# Patient Record
Sex: Male | Born: 1970 | Race: Black or African American | Hispanic: No | Smoking: Never smoker
Health system: Southern US, Community
[De-identification: ages and names within clinical notes are randomized; demographics above are authoritative.]

---

## 2002-10-27 ENCOUNTER — Emergency Department (HOSPITAL_COMMUNITY): Admission: EM | Admit: 2002-10-27 | Discharge: 2002-10-28 | Payer: Self-pay | Admitting: Emergency Medicine

## 2002-10-27 ENCOUNTER — Encounter: Payer: Self-pay | Admitting: Emergency Medicine

## 2007-05-04 ENCOUNTER — Emergency Department (HOSPITAL_COMMUNITY): Admission: EM | Admit: 2007-05-04 | Discharge: 2007-05-04 | Payer: Self-pay | Admitting: Emergency Medicine

## 2007-05-08 ENCOUNTER — Emergency Department (HOSPITAL_COMMUNITY): Admission: EM | Admit: 2007-05-08 | Discharge: 2007-05-08 | Payer: Self-pay | Admitting: Family Medicine

## 2007-05-23 ENCOUNTER — Ambulatory Visit: Payer: Self-pay | Admitting: Family Medicine

## 2007-05-29 ENCOUNTER — Encounter: Admission: RE | Admit: 2007-05-29 | Discharge: 2007-05-29 | Payer: Self-pay | Admitting: Family Medicine

## 2007-07-05 ENCOUNTER — Encounter: Admission: RE | Admit: 2007-07-05 | Discharge: 2007-07-05 | Payer: Self-pay | Admitting: Cardiovascular Disease

## 2007-07-11 ENCOUNTER — Inpatient Hospital Stay (HOSPITAL_COMMUNITY): Admission: RE | Admit: 2007-07-11 | Discharge: 2007-07-12 | Payer: Self-pay | Admitting: Cardiovascular Disease

## 2007-07-14 ENCOUNTER — Inpatient Hospital Stay (HOSPITAL_COMMUNITY): Admission: EM | Admit: 2007-07-14 | Discharge: 2007-07-17 | Payer: Self-pay | Admitting: Emergency Medicine

## 2008-10-28 ENCOUNTER — Emergency Department (HOSPITAL_COMMUNITY): Admission: EM | Admit: 2008-10-28 | Discharge: 2008-10-28 | Payer: Self-pay | Admitting: Emergency Medicine

## 2008-10-31 ENCOUNTER — Ambulatory Visit: Payer: Self-pay | Admitting: Family Medicine

## 2008-11-03 ENCOUNTER — Emergency Department (HOSPITAL_COMMUNITY): Admission: EM | Admit: 2008-11-03 | Discharge: 2008-11-03 | Payer: Self-pay | Admitting: Emergency Medicine

## 2010-05-26 ENCOUNTER — Ambulatory Visit (HOSPITAL_COMMUNITY)
Admission: RE | Admit: 2010-05-26 | Discharge: 2010-05-26 | Payer: Self-pay | Source: Home / Self Care | Attending: Orthopaedic Surgery | Admitting: Orthopaedic Surgery

## 2010-10-27 NOTE — Discharge Summary (Signed)
NAMEEMIL, WEIGOLD              ACCOUNT NO.:  [id number]   MEDICAL RECORD NO.:  [id number]          PATIENT TYPE:  INP   LOCATION:  4736                         FACILITY:  [facility]   PHYSICIAN:  Nanetta Batty, M.D.   DATE OF BIRTH:  [birth year]   DATE OF ADMISSION:  07/11/2007  DATE OF DISCHARGE:  07/12/2007                               DISCHARGE SUMMARY   DISCHARGE DIAGNOSES:  1. Claudication of lower extremities.  2. Abnormal nuclear stress test, cardiac.  3. Patent coronary artery with normal LV function by cardiac      catheterization.  4. Peripheral vascular disease with bilateral iliac disease; 80%      stenosis on the left, 70% right.  Undergoing PTA and stent      deployments bilaterally by Dr. [name].  5. Dyslipidemia.  6. Somewhat labile blood pressure.  7. Mild anemia.   DISCHARGE CONDITION:  Improved.   PROCEDURES:  July 11, 2007, combined left heart catheterization by  Dr. Nanetta Batty.   July 11, 2007, PV angiogram, lower extremities and proceeded on with  PTA and stent bilateral iliacs by Dr. [name].   DISCHARGE MEDICATIONS:  1. Aspirin 81 mg daily.  2. Metoprolol 25 mg b.i.d.  3. Plavix 75 mg daily.  4. Fish oil capsules, 1 daily for now, over-the-counter.  5. Zocor 10 mg daily. The  patient at the time of discharge, stated he      had been on over-the-counter cholesterol medication and he would      prefer to take it if possible.  In fact, his LDLs actually are      pretty good, so I gave him the option of either starting the Zocor,      holding the over-the-counter, but to take the over-the-counter      medicine.  To see Dr. [name] for followup.  Most likely he will need      the Zocor, as his triglycerides are up, his HDL is low.  He      definitely needs the fish oil and he may need some Niaspan, as      well, but I will leave that with Dr. [name] and Dr. Lynnea Ferrier.   He is also instructed to watch his diet, decrease white bread and  potatoes,  decrease sweets and alcohol.   DISCHARGE INSTRUCTIONS:  1. No work for at least 1 week.  2. Wash catheterization site with soap and water.  Call us if any      bleeding, swelling, or drainage.  3. Increase activities slowly.  4. May shower or bathe.  No lifting for 2 days.  No driving for 2      days.  5. Followup with Dr. [name] July 28, 2007 at 10:15 a.m.  6. He is scheduled for lower extremity arterial Dopplers on the second      floor of Dr. Hazle Coca office July 27, 2007 at 9:30 a.m.   HISTORY OF PRESENT ILLNESS:  The patient was seen by Dr. Ritta Slot,  his primary cardiologist, at the request of Dr. Susann Givens, as Dr. [name]  was actually out of  town and the patient's nuclear study had been  positive.  The patient had seen Dr. [name] at the request of Dr. Susann Givens  for abnormal lower extremity Dopplers and episodes of intermittent  claudication.  His ABIs on the right were 0.88; on the left 0.81 and it  did decrease after exercise 0.80 on the right and 0.73 on the left.  Because of peripheral vascular disease, he was set up for a nuclear  perfusion study and it revealed moderate ischemia in the basal, inferior  lateral, and mid-inferior lateral region with mild to moderate ischemia  at the base of the anterior septum, mid anterior septal region.  EF was  60-65%.  He denied any chest pain or shortness of breath.  It could be a  false positive study due to lack of symptoms, so he was kept on his  previous PV angiogram with plans to add a cardiac catheterization to  that, which were done.  He was started on metoprolol 25 b.i.d. and  aspirin 81 mg daily.   PAST MEDICAL HISTORY:  Otherwise was negative.   OUTPATIENT MEDICATIONS:  None.   ALLERGIES:  No known drug allergies.   PHYSICAL EXAMINATION AT DISCHARGE:  VITAL SIGNS:  Blood pressure ranged  152/86 to 116/60, pulse 76, respirations 20, temperature 97.9, oxygen  saturation on room air 96%.   HEART:  Regular rate and  rhythm.   LUNGS:  Clear.   ABDOMEN:  Positive bowel sounds.   EXTREMITIES:  Right groin stable.  No bleeding, no hematoma.  Pedal  pulses present.   LABORATORY DATA:  At discharge, hemoglobin 10.7, hematocrit 30.7,  platelets 202, WBC 7.5.  Prior to the procedure on July 07, 2007,  hemoglobin was 14.5, hematocrit 41.2, WBC 7.7, platelets 212.  It was  noted the patient had bled at the catheterization site on the day of the  procedure, but it had stopped bleeding and it was no longer bleeding,  and he had no groin pain and was able to ambulate without any limp at  all.  So, most likely, between hydration and bleeding at the  catheterization site, his hemoglobin did drop.  We will check that as an  out patient.  Our office will call the patient and have that checked on  Friday stat, to insure it is stable.   Other labs:  Sodium 140, potassium 3.9, BUN 10, creatinine 0.71, glucose  was up at 177, but fasting glucose was 91.  Total cholesterol 132, LDL  68, HDL 28, triglycerides 178.  Actually, this was improved.  He had  previously had cholesterol panel done with Dr. Susann Givens.  Total  cholesterol at that time was 182, triglycerides 89, HDL 45, and LDL 119.  So, on his over-the-counter cholesterol medicine, his HDL has dropped  and his triglycerides have gone up.  Again, it could be related to the  timing of the test and IV fluids.   The patient did well post-procedure except for initial bleeding, which  resolved.  By the morning of July 12, 2007, the patient was walking  in the hallway, anxious to go home.  He was having issues at home that  he was the only one who could deal with them and requested to go as  quickly as possible.  As he was stable, followup appointments were  arranged and he was discharged.      Darcella Gasman. Annie Paras, N.P.      Nanetta Batty, M.D.  Electronically Signed  LRI/MEDQ  D:  07/12/2007  T:  07/13/2007  Job:  [id number]   cc:   Nanetta Batty,  M.D.  Ritta Slot, MD  Sharlot Gowda, M.D.

## 2010-10-27 NOTE — Procedures (Signed)
EEG NUMBER:  [id number]   HISTORY:  This is a 40 year old patient who is being evaluated for  nocturnal type seizures.  This is a routine EEG.  No skull defects are  noted.   MEDICATIONS:  1. Plavix.  2. Lopressor.  3. Altace.  4. Zocor.  5. Niacin.  6. Lovenox.  7. Tylenol.   EEG classification normal.  Essentially normal awake.   DESCRIPTION OF THE RECORDING:  The background rhythm of this recording  consists of a fairly well modulated medium amplitude alpha rhythm of 9  Hz that is reactive to eye closure.  As the record progresses, photic  stimulation is performed resulting in a minimal but bilateral photic  driving response.  Hyperventilation is also performed resulting in some  minimal buildup of the background activity without significant slowing  seen.  At no time during the recording does there appear to be evidence  of spike or spike wave discharges or evidence of focal slowing.  EKG  monitor shows no evidence of cardiac rhythm abnormalities with a heart  rate of 78.   IMPRESSION:  This is an essentially normal EEG recording in the waking  state.  No evidence of ictal or interictal discharges were seen.      Marlan Palau, M.D.  Electronically Signed     EAV:WUJW  D:  07/17/2007 13:29:11  T:  07/17/2007 17:27:51  Job #:  [id number]

## 2010-10-27 NOTE — Cardiovascular Report (Signed)
NAME:  Adam Meyer, Adam Meyer NO.:  [id number]   MEDICAL RECORD NO.:  [id number]          PATIENT TYPE:  OBV   LOCATION:  2807                         FACILITY:  [facility]   PHYSICIAN:  Nanetta Batty, M.D.   DATE OF BIRTH:  [birth year]   DATE OF PROCEDURE:  07/11/2007  DATE OF DISCHARGE:                            CARDIAC CATHETERIZATION   [name] is a 40 year old married African American male referred for  claudication by Dr. Susann Givens.  He had Dopplers which showed bilateral  iliac disease.  Does have mild hyperlipidemia.  A Myoview stress test  was remarkable for ischemia in cerebrovascular territories which were  mild in nature.  Because of his claudication and documented vascular  disease by noninvasive modality, he presents now for diagnostic coronary  arteriography as well as abdominal aortography with bifemoral runoff and  potential endovascular treatment for claudication.   DESCRIPTION OF PROCEDURE:  The patient was brought to the second floor  [facility] in the postabsorptive state.  He was  premedicated p.o. Valium, IV Versed and fentanyl.  His right groin was  prepped and shaved in the usual sterile fashion, 1% Xylocaine was used  for local anesthesia.  A 6-French sheath was inserted into the right  femoral artery using standard Seldinger technique.  The 6-French right  and left Judkins diagnostic catheters along with a 6-French pigtail  catheter were used for selective coronary angiography, left  ventriculography.  Visipaque dye was used for the entirety of the case.  Retrograde aortic, left ventricular and pull-back pressures were  recorded.   HEMODYNAMIC RESULTS:  1. Aortic systolic pressure 118, diastolic pressure 68.  2. Left ventricular systolic pressure 116 and diastolic pressure 14.   SELECTIVE CORONARY ANGIOGRAPHY:  1. Left main normal.  2. LAD normal.  3. Left circumflex was dominant and normal.  4. Right coronary artery was  nondominant and normal.   LEFT VENTRICULOGRAPHY:  RAO left ventriculogram was performed using 20  mL of Visipaque dye at 10 mL per second.  Overall LVEF was estimated at  greater than 60% without focal wall motion abnormalities.   IMPRESSION:  [name] has essentially normal coronary arteries with a  left dominant system, normal left ventricular function.  His Myoview was  false positive.  We will proceed with abdominal aortography with  bifemoral runoff.      Nanetta Batty, M.D.  Electronically Signed     JB/MEDQ  D:  07/11/2007  T:  07/11/2007  Job:  [id number]   cc:   2nd Floor [facility]  [facility]  Sharlot Gowda, M.D.

## 2010-10-27 NOTE — Cardiovascular Report (Signed)
Adam Meyer, Adam Meyer              ACCOUNT NO.:  [id number]   MEDICAL RECORD NO.:  [id number]          PATIENT TYPE:  INP   LOCATION:  2028                         FACILITY:  [facility]   PHYSICIAN:  Darlin Priestly, MD  DATE OF BIRTH:  19-Oct-1970   DATE OF PROCEDURE:  07/14/2007  DATE OF DISCHARGE:                            CARDIAC CATHETERIZATION   PROCEDURES:  1. Left heart catheterization.  2. Coronary angiography.  3. Left ventriculogram.  4. Abdominal aortogram.   ATTENDING:  Darlin Priestly, MD   COMPLICATIONS:  None.   INDICATIONS:  Mr. Bossier is a 40 year old male patient of Dr. Nanetta Batty with a history of abnormal stress test as well as abnormal lower  extremity Dopplers.  He underwent cardiac catheterization on July 10, 2007, revealing clean coronaries with reportedly normal EF.  He had  bilateral iliac disease and underwent bilateral iliac stenting by Dr.  [name].  He represented on July 14, 2007, with a complaint of  increasing chest pain.  He was noted to have a mildly elevated troponin  as well as some mild ST changes and code STEMI was activated.  He is now  brought for cardiac catheterization to revise coronary anatomy.   DESCRIPTION OF PROCEDURE:  After gaining informed consent, the patient  was brought to the cardiac catheterization lab.  Right groin shaved,  prepped and draped in the usual sterile fashion.  Using modified  Seldinger technique, a 6-French arterial sheath was inserted into the  right femoral artery.  A 6-French diagnostic catheter was used to  perform diagnostic angiography.   Left main is a large, short vessel with no significant disease.   LAD is a large vessel coursing the diagonal branch.  The LAD has no  significant disease.   Diagonal-1 is a medium-size vessel that bifurcates distally with no  significant disease.   Left coronary artery is a large vessel dominant that goes to two obtuse  marginal branches as well as a  PDA.  The circumflex has no significant  disease.   First and second OMs are small to medium-sized vessels with no  significant disease.   The PDA is a large vessel which bifurcates in the segment with no  significant disease.   The right coronary is a small nondominant vessel with no significant  disease.   Left ventriculogram reveals estimated EF of approximately 40% with  global hypokinesis.  There appears to be mild anterior, anterolateral,  apical and inferoapical hypokinesis.   Abdominal aortogram reveals widely patent bilateral iliac stents.   HEMODYNAMIC RESULTS:  Arterial pressure 188/57, LV systemic pressure  80/10, LVEDP of 14.   CONCLUSIONS:  1. No significant coronary artery disease.  2. Mild to moderate LV systolic function.  Wall motion abnormalities      noted above.  3. Widely patent bilateral iliac stents.      Darlin Priestly, MD  Electronically Signed     RHM/MEDQ  D:  07/14/2007  T:  07/15/2007  Job:  [id number]

## 2010-10-27 NOTE — Cardiovascular Report (Signed)
NAMELEM, PEARY NO.:  [id number]   MEDICAL RECORD NO.:  [id number]          PATIENT TYPE:  OBV   LOCATION:  2807                         FACILITY:  [facility]   PHYSICIAN:  Nanetta Batty, M.D.   DATE OF BIRTH:  1971-01-12   DATE OF PROCEDURE:  07/11/2007  DATE OF DISCHARGE:                            CARDIAC CATHETERIZATION   PERIPHERAL ANGIOGRAM.     Mr. Osuna is a 40 year old African-American male with claudication,  positive risk factors, and abnormal Myoview who underwent diagnostic  coronary angiography today revealing a normal coronary arteries and  normal LV function.  He had Dopplers that were suggested of high-grade  bilateral iliac disease.  He presents now for abdominal aortography,  bifemoral runoff and potential endovascular therapy for claudication.   PROCEDURE DESCRIPTION:  The existing 6-French sheath in the right  femoral artery was used.  A 6-French pigtail catheter was placed in the  mid abdominal aorta just above the renal arteries.  Abdominal  aortography with bifemoral runoff was performed using bolus chase  digital subtraction step table technique.  Visipaque dye was used for  the entirety of the case.  Retrograde aortic pressures were monitored  during the case.   ANGIOGRAPHIC RESULTS:  1. Abdominal aorta.      a.     Renal arteries - normal.      b.     Infrarenal abdominal aorta - normal.  2. Left lower extremity.      a.     An 80% segmental ostial/proximal left common iliac artery       stenosis.      b.     Normal SFA with three-vessel runoff.  3. Right lower extremity.      a.     A 70% segmental proximal right common iliac artery stenosis       with a 30-mm pullback gradient after administration of intra-       arterial nitroglycerin.      b.     Normal SFA with obvious runoff.   IMPRESSION:  Mr. Blades has high-grade bilateral iliac disease.  We  will proceed with PTA and stenting using kissing-stent technique.   Dr.  Yates Decamp assisted during the procedure.   The patient received 3000 units of heparin intravenously.  The existing  6-French sheath was exchanged over a 0.035 Wholey wire for a 7-French  long bright tip sheath.  Femoral access was obtained in the left after  administration of lidocaine for local anesthesia using a 7-French bright  tip 30-cm long sheath.  Two Wholey wires were used.  Simultaneous  deployment of Cordis Genesis 8 x 37 expandable stents was performed  after careful angiographic and fluoroscopic positioning of the stent at  the bifurcation.  There were expanded at approximately 6 atmospheres  resulting in an excellent angiographic rebuilding the carina of the  iliac bifurcation.  The patient did experience abdominal pain with  balloon stent deployment which resolved with deflation.  Distal edge of  the left common iliac stent was then flared 10 x 2 Cordis balloon at  nominal pressures.  Completion abdominal aortography revealed reduction  of high-grade bilateral iliac disease to 0% residual with excellent  stent apposition.   IMPRESSION:  Successful PTA and stenting of the distal abdominal aorta  iliac bifurcation using Cordis Genesis balloon expandable stent with an  excellent well.  The patient tolerated procedure well.  ACT was  measured, and the sheaths were removed.  Pressure was held on the groin  to achieve hemostasis.  The patient left the lab in stable condition.  He will be given 300 mg of p.o. Plavix and treated with aspirin, Plavix  on ongoing.  He will be gently hydrated, and discharged home in the  morning if he remains clinically stable.  We will obtain routine labs.  We will get follow-up Dopplers and ABIs.  We will see him back in the  office after that in follow-up.  He left the lab in stable condition.  Dr. Colletta Maryland was notified.      Nanetta Batty, M.D.  Electronically Signed     JB/MEDQ  D:  07/11/2007  T:  07/11/2007  Job:  [id number]    cc:   2nd Floor [facility]  [facility]  Sharlot Gowda, M.D.
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
## 2011-03-04 LAB — I-STAT 8, (EC8 V) (CONVERTED LAB)
BUN: 35 — ABNORMAL HIGH
Bicarbonate: 23.2
Hemoglobin: 15.6
Operator id: 198171
Sodium: 136
TCO2: 24
pCO2, Ven: 33.5 — ABNORMAL LOW

## 2011-03-04 LAB — CBC
HCT: 35.8 — ABNORMAL LOW
HCT: 36.8 — ABNORMAL LOW
HCT: 39.5
Hemoglobin: 12.4 — ABNORMAL LOW
Hemoglobin: 13.2
Hemoglobin: 14.4
MCHC: 33.4
MCHC: 33.6
MCHC: 34.6
MCV: 92.9
MCV: 93.8
MCV: 94.6
Platelets: 175
Platelets: 177
Platelets: 190
Platelets: 201
RDW: 15.3
RDW: 15.7 — ABNORMAL HIGH
RDW: 15.8 — ABNORMAL HIGH
RDW: 15.9 — ABNORMAL HIGH

## 2011-03-04 LAB — BASIC METABOLIC PANEL
BUN: 21
BUN: 8
CO2: 25
CO2: 25
Chloride: 102
Chloride: 102
Chloride: 105
Creatinine, Ser: 0.87
GFR calc Af Amer: >60
Glucose, Bld: 110 — ABNORMAL HIGH
Glucose, Bld: 121 — ABNORMAL HIGH
Glucose, Bld: 91
Potassium: 3.6
Potassium: 4
Sodium: 136
Sodium: 136

## 2011-03-04 LAB — RAPID URINE DRUG SCREEN, HOSP PERFORMED: Benzodiazepines: POSITIVE — AB

## 2011-03-04 LAB — CK TOTAL AND CKMB (NOT AT ARMC)
CK, MB: 18.3 — ABNORMAL HIGH
Relative Index: 0.4
Relative Index: 0.5
Total CK: 8489 — ABNORMAL HIGH

## 2011-03-04 LAB — TROPONIN I: Troponin I: 2.14

## 2011-03-04 LAB — POCT CARDIAC MARKERS
CKMB, poc: 54.7
Myoglobin, poc: >500

## 2011-03-04 LAB — COMPREHENSIVE METABOLIC PANEL
BUN: 25 — ABNORMAL HIGH
CO2: 23
Calcium: 8.1 — ABNORMAL LOW
Creatinine, Ser: 1.06
GFR calc non Af Amer: >60
Glucose, Bld: 108 — ABNORMAL HIGH

## 2011-03-04 LAB — DIFFERENTIAL
Basophils Absolute: 0
Basophils Relative: 0
Lymphocytes Relative: 7 — ABNORMAL LOW
Monocytes Absolute: 0.7
Neutro Abs: 18.6 — ABNORMAL HIGH
Neutrophils Relative %: 90 — ABNORMAL HIGH

## 2011-03-04 LAB — URINE MICROSCOPIC-ADD ON

## 2011-03-04 LAB — URINALYSIS, ROUTINE W REFLEX MICROSCOPIC
Glucose, UA: NEGATIVE
Leukocytes, UA: NEGATIVE
Protein, ur: 30 — AB
Specific Gravity, Urine: 1.026
Urobilinogen, UA: 0.2

## 2011-03-04 LAB — PROTIME-INR
INR: 1.1
Prothrombin Time: 14.7

## 2011-03-04 LAB — POCT I-STAT CREATININE
Creatinine, Ser: 1.3
Operator id: 198171

## 2011-03-04 LAB — D-DIMER, QUANTITATIVE (NOT AT ARMC)

## 2011-03-04 LAB — APTT

## 2011-03-05 LAB — URINE CULTURE
Colony Count: NO GROWTH
Culture: NO GROWTH
Special Requests: NEGATIVE

## 2011-03-05 LAB — CK TOTAL AND CKMB (NOT AT ARMC)
CK, MB: 11.1 — ABNORMAL HIGH
CK, MB: 9.3 — ABNORMAL HIGH
Relative Index: 0.3
Total CK: 4156 — ABNORMAL HIGH

## 2011-03-05 LAB — BASIC METABOLIC PANEL
BUN: 11
BUN: 14
CO2: 25
Calcium: 8.7
Calcium: 9
Creatinine, Ser: 1.11
GFR calc non Af Amer: >60
Glucose, Bld: 101 — ABNORMAL HIGH
Glucose, Bld: 99
Sodium: 138

## 2011-03-05 LAB — HEPATIC FUNCTION PANEL
Albumin: 3 — ABNORMAL LOW
Total Bilirubin: 0.8
Total Protein: 6.2

## 2011-03-05 LAB — DIFFERENTIAL
Basophils Absolute: 0
Eosinophils Absolute: 0.1
Eosinophils Relative: 1
Lymphocytes Relative: 40
Lymphs Abs: 3.6
Monocytes Absolute: 0.7

## 2011-03-05 LAB — CBC
HCT: 34.7 — ABNORMAL LOW
Hemoglobin: 11.7 — ABNORMAL LOW
MCHC: 34.5
Platelets: 166
Platelets: 177
RDW: 14.8
RDW: 15

## 2011-03-05 LAB — URINALYSIS, ROUTINE W REFLEX MICROSCOPIC
Glucose, UA: NEGATIVE
Hgb urine dipstick: NEGATIVE
Specific Gravity, Urine: >1.046 — ABNORMAL HIGH

## 2011-03-05 LAB — TROPONIN I: Troponin I: 0.19 — ABNORMAL HIGH

## 2011-03-23 LAB — WOUND CULTURE

## 2011-03-28 ENCOUNTER — Emergency Department (HOSPITAL_COMMUNITY)
Admission: EM | Admit: 2011-03-28 | Discharge: 2011-03-28 | Disposition: A | Discharge status: Home / Self Care | Payer: Self-pay | Attending: Emergency Medicine | Admitting: Emergency Medicine

## 2011-03-28 DIAGNOSIS — M545 Low back pain, unspecified: Secondary | ICD-10-CM | POA: Insufficient documentation

## 2011-03-28 DIAGNOSIS — IMO0002 Reserved for concepts with insufficient information to code with codable children: Secondary | ICD-10-CM | POA: Insufficient documentation

## 2011-03-28 DIAGNOSIS — I739 Peripheral vascular disease, unspecified: Secondary | ICD-10-CM | POA: Insufficient documentation

## 2011-03-28 DIAGNOSIS — S335XXA Sprain of ligaments of lumbar spine, initial encounter: Secondary | ICD-10-CM | POA: Insufficient documentation

## 2020-04-16 ENCOUNTER — Other Ambulatory Visit: Payer: Self-pay

## 2020-04-16 ENCOUNTER — Emergency Department (HOSPITAL_COMMUNITY)
Admission: EM | Admit: 2020-04-16 | Discharge: 2020-04-16 | Disposition: A | Discharge status: Home / Self Care | Payer: Self-pay | Attending: Emergency Medicine | Admitting: Emergency Medicine

## 2020-04-16 DIAGNOSIS — M25511 Pain in right shoulder: Secondary | ICD-10-CM | POA: Insufficient documentation

## 2020-04-16 DIAGNOSIS — Z7982 Long term (current) use of aspirin: Secondary | ICD-10-CM | POA: Insufficient documentation

## 2020-04-16 MED ORDER — KETOROLAC TROMETHAMINE 30 MG/ML IJ SOLN
15.0000 mg | Freq: Once | INTRAMUSCULAR | Status: AC
  Administered 2020-04-16: 15 mg via INTRAMUSCULAR
  Filled 2020-04-16: qty 1

## 2020-04-16 MED ORDER — OXYCODONE-ACETAMINOPHEN 5-325 MG PO TABS
1.0000 | ORAL_TABLET | Freq: Three times a day (TID) | ORAL | 0 refills | Status: AC | PRN
Start: 2020-04-16 — End: 2020-04-18

## 2020-04-16 NOTE — ED Triage Notes (Signed)
Patient arrives to ED with increased right shoulder pain. Pt states aggravated his tendonitis on his rotator cuff.

## 2020-04-16 NOTE — Discharge Instructions (Addendum)
Seen here for acute shoulder pain.  Exam looks reassuring.  I recommend ibuprofen 600mg  3 times daily for the next 7 days.  Continue applying heat to the area and wearing a brace as this can help with pain and inflammation.  I would abstain from overhead activities and rest your shoulder.  Please follow-up with your PCP for Dr. for further management.  Come back to the emergency department if you develop chest pain, shortness of breath, severe abdominal pain, uncontrolled nausea, vomiting, diarrhea.

## 2020-04-16 NOTE — ED Notes (Signed)
Pt refusing dc vitals. Not cooperative with staff, yelling and cursing at this RN and EDPA.

## 2020-04-16 NOTE — ED Notes (Signed)
Pt at the desk yelling at this RN. Pt states, "I did not come to the ED to be sent home with a prescription for Ibuprofen" EDPA Uvaldo Rising to bedside

## 2020-04-16 NOTE — ED Provider Notes (Signed)
[facility] EMERGENCY DEPARTMENT Provider Note   CSN: [id number] Arrival date & time: 04/16/20  0805     History Chief Complaint  Patient presents with  . Shoulder Pain    Adam Meyer is a 49 y.o. male.  HPI   Patient with no significant medical history presents to the emergency department with chief complaints of right shoulder pain.  Patient states the pain started Friday and has gradually gotten worse.  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  He has tried resting, NSAIDs and wearing his brace but still has continued pain.  He describes the right shoulder pain as  throbbing sensation and he feels rating down his arm.  He denies paresthesias or weakness in his extremities, and endorses moving it makes the pain much worse.  He states he has experienced in the past, was diagnosed with acute bursitis.  Generally he wears his brace,  applies warm compresses and takes NSAIDs which helps.  He is also has received cortisone shots but has not seen his orthopedic doctor for this.  Patient denies IV drug use, red hot swollen joints, fevers, chills, rashes, denies any recent trauma to the area.  Patient denies headache, fever, chills, shortness of breath, chest pain, dumping, nausea, vomiting, diarrhea, pedal edema.  No past medical history on file.  There are no problems to display for this patient.        No family history on file.  Social History   Tobacco Use  . Smoking status: Not on file  Substance Use Topics  . Alcohol use: Not on file  . Drug use: Not on file    Home Medications Prior to Admission medications   Medication Sig Start Date End Date Taking? Authorizing Provider  aspirin 81 MG chewable tablet Chew 81 mg by mouth daily.   Yes [provider]  ibuprofen (ADVIL) 200 MG tablet Take 200 mg by mouth every 6 (six) hours as needed for mild pain.   Yes [provider]  Multiple Vitamin (MULTIVITAMIN WITH MINERALS) TABS tablet Take 1 tablet by mouth daily.   Yes [provider]    Allergies    Patient has no known allergies.  Review of Systems   Review of Systems  Constitutional: Negative for chills and fever.  HENT: Negative for congestion, tinnitus, trouble swallowing and voice change.   Respiratory: Negative for cough and shortness of breath.   Cardiovascular: Negative for chest pain.  Gastrointestinal: Negative for abdominal pain, diarrhea, nausea and vomiting.  Genitourinary: Negative for enuresis.  Musculoskeletal: Negative for back pain.       Endorses right shoulder pain.  Skin: Negative for rash.  Neurological: Negative for dizziness and headaches.  Hematological: Does not bruise/bleed easily.    Physical Exam Updated Vital Signs BP (!) 134/94 (BP Location: Left Arm)   Pulse 97   Temp 97.9 F (36.6 C) (Oral)   Resp 15   Ht 6\' 3"  (1.905 m)   Wt 95.3 kg   SpO2 100%   BMI 26.25 kg/m   Physical Exam Vitals and nursing note reviewed.  Constitutional:      General: He is not in acute distress.    Appearance: Normal appearance. He is not ill-appearing or diaphoretic.  HENT:     Head: Normocephalic and atraumatic.     Nose: No congestion or rhinorrhea.  Eyes:     General:  No scleral icterus.       Right eye: No discharge.        Left eye: No discharge.     Conjunctiva/sclera: Conjunctivae normal.  Cardiovascular:     Rate and Rhythm: Normal rate and regular rhythm.     Heart sounds: No murmur heard.  No friction rub. No gallop.   Pulmonary:     Effort: Pulmonary effort is normal. No respiratory distress.     Breath sounds: Normal breath sounds. No stridor. No wheezing or rales.  Musculoskeletal:        General: Tenderness present. No swelling or signs of injury.     Cervical back: Neck supple.     Right lower leg: No edema.     Left lower leg: No edema.     Comments: Patient's right extremity was  visualized no erythema, edema, ecchymosis, lacerations or abrasions noted, no other gross abnormalities noted.  He had tenderness to palpation along the anterior aspect of his deltoid, no crepitus felt during passive movement of his shoulder.  He had full range of motion,  5/5 strength neurovascular fully intact in all 4 extremities.  Skin:    General: Skin is warm and dry.     Coloration: Skin is not jaundiced or pale.     Findings: No lesion or rash.     Comments: Skin exam was performed no track marks, rashes, swollen, erythematous, warm joints noted on exam.  Neurological:     Mental Status: He is alert and oriented to person, place, and time.  Psychiatric:        Mood and Affect: Mood normal.     ED Results / Procedures / Treatments   Labs (all labs ordered are listed, but only abnormal results are displayed) Labs Reviewed - No data to display  EKG None  Radiology No results found.  Procedures Procedures (including critical care time)  Medications Ordered in ED Medications  ketorolac (TORADOL) 30 MG/ML injection 15 mg (15 mg Intramuscular Given 04/16/20 0920)    ED Course  I have reviewed the triage vital signs and the nursing notes.  Pertinent labs & imaging results that were available during my care of the patient were reviewed by me and considered in my medical decision making (see chart for details).    MDM Rules/Calculators/A&P                          Patient presents with right shoulder pain.  He is alert, did not appear in acute distress, vital signs reassuring.  Due to well-appearing patient, benign physical exam further lab and imaging not warranted at this time.  I have low suspicion for septic arthritis as patient denies IV drug use, skin exam was performed no erythematous, edematous, warm joints noted on exam, no new heart murmur heard on exam.  Low suspicion for fracture or dislocation as patient denies recent trauma, no deformities or crepitus felt on  examination.  Low suspicion for ligament or tendon damage as area was palpated no gross defects noted, they had full range of motion as well as 5/5 strength.  Low suspicion for compartment syndrome as area was palpated it was soft to the touch, neurovascular fully intact.  I suspect patient has a possible flareup of acute bursitis or possible tendinitis.  Will recommend NSAIDs, brace, warm compresses and follow-up with Ortho for further evaluation.  Vital signs have remained stable, no indication for hospital admission.  Patient discussed  with attending and they agreed with assessment and plan.  Patient given at home care as well strict return precautions.  Patient verbalized that they understood agreed to said plan.   Final Clinical Impression(s) / ED Diagnoses Final diagnoses:  Acute pain of right shoulder    Rx / DC Orders ED Discharge Orders    None       Carroll Sage, PA-C 04/16/20 2482    Mancel Bale, MD 04/16/20 2205

## 2020-04-22 ENCOUNTER — Ambulatory Visit: Payer: Self-pay

## 2020-04-22 ENCOUNTER — Encounter: Payer: Self-pay | Admitting: Family Medicine

## 2020-04-22 ENCOUNTER — Other Ambulatory Visit: Payer: Self-pay

## 2020-04-22 ENCOUNTER — Ambulatory Visit (INDEPENDENT_AMBULATORY_CARE_PROVIDER_SITE_OTHER): Payer: Self-pay | Admitting: Family Medicine

## 2020-04-22 DIAGNOSIS — M25511 Pain in right shoulder: Secondary | ICD-10-CM

## 2020-04-22 MED ORDER — PREDNISONE 10 MG PO TABS: ORAL_TABLET | ORAL | 0 refills | Status: DC

## 2020-04-22 MED ORDER — BACLOFEN 10 MG PO TABS
5.0000 mg | ORAL_TABLET | Freq: Three times a day (TID) | ORAL | 3 refills | Status: DC | PRN
Start: 2020-04-22 — End: 2020-10-10

## 2020-04-22 NOTE — Progress Notes (Signed)
Office Visit Note   Patient: Adam Meyer           Date of Birth: 01-Jul-1970           MRN: [id number] Visit Date: 04/22/2020 Requested by: No referring provider defined for this encounter. PCP: Pcp, No  Subjective: Chief Complaint  Patient presents with  . Right Shoulder - Pain    Pain mainly around the top of the scapula and down the right arm. He feels a pain into the little finger of the right hand. This pain started 04/10/20. Has had pains in different areas of the shoulder/scapular region in the past.     HPI: 49yo M presenting to clinic with severe right shoulder pain x2 weeks. Patient states that he was working on a car suspension slightly above shoulder level, when he started to experience severe pain throughout his right shoulder/neck/scapular area, and radiating down into his right 5th finger. He describes this pain as a deep, throbbing ache, accompanied by shooting sensations and occasional numbness. States that, initially, this felt like a recurrence of his historical shoulder bursitis/tendonitis, however his pain has progressively worsened to the point where he cannot function unless he is holding ice against his arm. States he has been wearing a shoulder brace, as the weight of his arm causes a throbbing around his scapula and along his trapezius. Denies midline neck pain, and states that his pain starts along the trapezius insertion on the right. Says he hasn't been able to sleep since his pain started, as he has to keep waking up to refresh the ice as it melts, or the pain in his upper arm becomes too severe.  He denies any trauma.               ROS:   All other systems were reviewed and are negative.  Objective: Vital Signs: There were no vitals taken for this visit.  Physical Exam:  General:  Alert and oriented, in no acute distress. Pulm:  Breathing unlabored. Psy:  Normal mood, congruent affect. Skin:  Right shoulder/arm with no bruising, rashes, or erythema.  Overlying skin intact.    Right Shoulder Exam:  Inspection: Symmetric muscle mass, no atrophy or deformity, no scars. Palpation: Significant tenderness over superior trapezius on right, as well as levator scap and rhomboids/inferior trap.  Tenderness to palpation along right deltoid insertion, and along biceps.  No tenderness to palpation over the [facility] joint.  Range of motion: Full range of motion in forward flexion, abduction and extension.  Endorses severe pain throughout ROM testing.   Rotator cuff testing:  Endorses significant pain with empty can, and demonstrates weakness- which he states is due to pain.   Poorly tolerates muscle strength testing.   Impingement testing: endorses pain with Juanito Doom causes worsening of pain along superior trapezius.   Strength testing:  5 out of 5 strength with wrist extension (C6), wrist flexion (C7), grip strength (C8), and finger abduction (T1).  Sensation: Intact to light touch throughout bilateral upper extremities.   Brisk distal capillary refill.    Imaging: Right Shoulder [place]: Rotator cuff appears intact, with no significant overlying fluid around subscap, supraspinatus, infra or TM. Does have some increase fluid signal within subacromial bursa.  Biceps tendon intact, with no surrounding fluid.  AC Joint without effusion.   Impression: Rotator cuff intact.   Assessment & Plan: 49yo M presenting to clinic with concerns of acute, severe right shoulder pain of atraumatic origin. Examination concerning for  cervical radiculopathy, however also with significantly tender trigger points within [facility] and trapezius musculature.  - Treatment options discussed, including medications for suspected neuropathic inflammation as well as dextrose for trigger points. Patient was agreeable with plan.  - Prolotherapy Trigger point injection attempted on right levator scap, which patient tolerated poorly. Procedure aborted due to excessive  patient movement.  - Medrol Dose back - Return precautions were discussed - If pain does not improve with Medrol Dose Pack for suspected nerve inflammation, patient would likely require MRI for further evaluation.  - Contact clinic following steroid burst to report on status.  - Patient was agreeable with plan, with no further questions or concerns.     Procedures: Right Levator Scapulae Trigger Point Injection:  Risks and benefits discussed, verbal consent obtained.  Point of maximal tenderness located and marked. Overlying skin was prepped with alcohol wipe.  Mixture of 20% dextrose in Lidocaine was injected into the area- however patient's excessive movement and pain caused for procedure to be aborted. Approximately 1cc of Injectate was delivered to the trigger point prior to truncating procedure.      PMFS History: There are no problems to display for this patient.  History reviewed. No pertinent past medical history.  History reviewed. No pertinent family history.  History reviewed. No pertinent surgical history. Social History   Occupational History  . Not on file  Tobacco Use  . Smoking status: Not on file  Substance and Sexual Activity  . Alcohol use: Not on file  . Drug use: Not on file  . Sexual activity: Not on file

## 2020-04-22 NOTE — Patient Instructions (Signed)
   Gaspar Bidding, DO  ADDRESS [address] [place], [place] [address] CONTACT INFO [email or web address] [phone]

## 2020-04-22 NOTE — Progress Notes (Signed)
I saw and examined the patient with Dr. Marga Hoots and agree with assessment and plan as outlined.  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  Severe right neck/shoulder/arm pain.  Symptoms suggest radiculopathy.  Exam reveals multiple trigger points.    Attempted to inject one trigger point today, but he did not tolerate it well.  Will try prednisone, baclofen.  Referral to Dr. Berline Chough for OMT.  MRI C-Spine if symptoms persist.

## 2020-08-21 ENCOUNTER — Telehealth: Payer: Self-pay | Admitting: Family Medicine

## 2020-08-21 DIAGNOSIS — M792 Neuralgia and neuritis, unspecified: Secondary | ICD-10-CM

## 2020-08-21 NOTE — Telephone Encounter (Signed)
Please advise 

## 2020-08-21 NOTE — Telephone Encounter (Signed)
X-Rays of shoulder and neck, and MRI of neck have been ordered for [facility].  Pain pattern is more consistent with a pinched nerve in the neck rather than a shoulder problem.

## 2020-08-21 NOTE — Telephone Encounter (Signed)
Patient called requesting a referral be sent for an MRI of right shoulder. Please call patient about this matter at [phone].

## 2020-08-21 NOTE — Telephone Encounter (Signed)
I called and advised the patient of the plan. 

## 2020-08-22 ENCOUNTER — Other Ambulatory Visit: Payer: Self-pay | Admitting: Family Medicine

## 2020-08-22 DIAGNOSIS — Z77018 Contact with and (suspected) exposure to other hazardous metals: Secondary | ICD-10-CM

## 2020-08-29 ENCOUNTER — Other Ambulatory Visit: Payer: Self-pay

## 2020-08-29 ENCOUNTER — Ambulatory Visit (INDEPENDENT_AMBULATORY_CARE_PROVIDER_SITE_OTHER): Payer: 59 | Admitting: Family Medicine

## 2020-08-29 DIAGNOSIS — R200 Anesthesia of skin: Secondary | ICD-10-CM

## 2020-08-29 NOTE — Progress Notes (Signed)
   Office Visit Note   Patient: Adam Meyer           Date of Birth: 26-Oct-1970           MRN: [id number] Visit Date: 08/29/2020 Requested by: No referring provider defined for this encounter. PCP: Pcp, No  Subjective: Chief Complaint  Patient presents with  . Left Leg - Numbness    HPI: He is here with left leg "numbness".  He states that he had stenting of his iliac arteries in 2009.  At that time he was a smoker and under a lot of stress.  This was done by Dr. [name] and Dr. Jacinto Halim.  He did very well until this past year or so, he has not been able to exercise to get his heart rate up because when he does, he starts feeling a numbness sensation in his left upper thigh similar to the symptoms he felt prior to needing his stent placement.  He is eating healthfully and no longer smokes cigarettes, does not drink alcohol on a regular basis.  Overall he is taking good care of himself.              ROS:   All other systems were reviewed and are negative.  Objective: Vital Signs: There were no vitals taken for this visit.  Physical Exam:  General:  Alert and oriented, in no acute distress. Pulm:  Breathing unlabored. Psy:  Normal mood, congruent affect. Skin: He still has hair on the dorsum of some of his toes. Left leg: He has palpable but diminished pulses of posterior tibial and dorsalis pedis arteries of both legs.  Strong pulses in the radial arteries of the wrist.   Imaging: No results found.  Assessment & Plan: 1.  Possible vascular occlusion causing sensation of left leg numbness -I will refer him back to Dr. [name] for further evaluation.  If work-up is negative for a cardiovascular source, then we will pursue possible lumbar source.     Procedures: No procedures performed        PMFS History: There are no problems to display for this patient.  No past medical history on file.  No family history on file.  No past surgical history on file. Social History    Occupational History  . Not on file  Tobacco Use  . Smoking status: Not on file  . Smokeless tobacco: Not on file  Substance and Sexual Activity  . Alcohol use: Not on file  . Drug use: Not on file  . Sexual activity: Not on file

## 2020-09-11 ENCOUNTER — Ambulatory Visit
Admission: RE | Admit: 2020-09-11 | Discharge: 2020-09-11 | Disposition: A | Discharge status: Home / Self Care | Payer: 59 | Source: Ambulatory Visit | Attending: Family Medicine | Admitting: Family Medicine

## 2020-09-11 ENCOUNTER — Other Ambulatory Visit: Payer: Self-pay

## 2020-09-11 ENCOUNTER — Inpatient Hospital Stay: Admission: RE | Admit: 2020-09-11 | Payer: Self-pay | Source: Ambulatory Visit

## 2020-09-11 DIAGNOSIS — M792 Neuralgia and neuritis, unspecified: Secondary | ICD-10-CM

## 2020-09-12 ENCOUNTER — Telehealth: Payer: Self-pay | Admitting: Family Medicine

## 2020-09-12 DIAGNOSIS — M25511 Pain in right shoulder: Secondary | ICD-10-CM

## 2020-09-12 DIAGNOSIS — M792 Neuralgia and neuritis, unspecified: Secondary | ICD-10-CM

## 2020-09-12 NOTE — Addendum Note (Signed)
Addended by: Lillia Carmel on: 09/12/2020 03:48 PM   Modules accepted: Orders

## 2020-09-12 NOTE — Telephone Encounter (Signed)
I called and spoke with patient he would like to be referred to PT

## 2020-09-12 NOTE — Telephone Encounter (Signed)
Orders placed.

## 2020-09-12 NOTE — Telephone Encounter (Signed)
MRI scan shows disc protrusions and bone spurs at several levels, but the most severe is on the right at C5-6 where there is impingement of the right C6 nerve root and severe narrowing of the nerve opening.  This would explain the ongoing shoulder pain.  Treatment options would include: -Referral to physical therapy to see if they can get the discs to reposition. -Referral for epidural steroid injection. -Referral for surgical consult.

## 2020-09-16 ENCOUNTER — Ambulatory Visit: Payer: 59 | Admitting: Cardiovascular Disease

## 2020-09-30 ENCOUNTER — Other Ambulatory Visit: Payer: Self-pay

## 2020-09-30 ENCOUNTER — Ambulatory Visit: Payer: 59 | Admitting: Rehabilitative and Restorative Service Providers"

## 2020-09-30 ENCOUNTER — Encounter: Payer: Self-pay | Admitting: Rehabilitative and Restorative Service Providers"

## 2020-09-30 DIAGNOSIS — M5412 Radiculopathy, cervical region: Secondary | ICD-10-CM

## 2020-09-30 DIAGNOSIS — M6281 Muscle weakness (generalized): Secondary | ICD-10-CM

## 2020-09-30 DIAGNOSIS — R293 Abnormal posture: Secondary | ICD-10-CM | POA: Diagnosis not present

## 2020-09-30 DIAGNOSIS — R600 Localized edema: Secondary | ICD-10-CM

## 2020-09-30 NOTE — Patient Instructions (Signed)
Access Code: [email or web address] URL: [email or web address] Date: 09/30/2020 Prepared by: Pauletta Browns  Exercises Standing Scapular Retraction - 5 x daily - 7 x weekly - 1 sets - 5 reps - 5 second hold Standing Isometric Cervical Extension with Manual Resistance - 3-5 x daily - 7 x weekly - 1 sets - 5 reps - 5 seconds hold
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
## 2020-10-10 ENCOUNTER — Ambulatory Visit (INDEPENDENT_AMBULATORY_CARE_PROVIDER_SITE_OTHER): Payer: 59 | Admitting: Cardiovascular Disease

## 2020-10-10 ENCOUNTER — Other Ambulatory Visit: Payer: Self-pay

## 2020-10-10 ENCOUNTER — Encounter: Payer: Self-pay | Admitting: Cardiovascular Disease

## 2020-10-10 DIAGNOSIS — I739 Peripheral vascular disease, unspecified: Secondary | ICD-10-CM | POA: Insufficient documentation

## 2020-10-10 LAB — HEPATIC FUNCTION PANEL
ALT: 23 IU/L (ref 0–44)
AST: 24 IU/L (ref 0–40)
Albumin: 4.6 g/dL (ref 4.0–5.0)
Alkaline Phosphatase: 109 IU/L (ref 44–121)
Bilirubin Total: 0.3 mg/dL (ref 0.0–1.2)
Bilirubin, Direct: 0.11 mg/dL (ref 0.00–0.40)
Total Protein: 7.3 g/dL (ref 6.0–8.5)

## 2020-10-10 LAB — LIPID PANEL
Chol/HDL Ratio: 5.1 ratio — ABNORMAL HIGH (ref 0.0–5.0)
Cholesterol, Total: 231 mg/dL — ABNORMAL HIGH (ref 100–199)
HDL: 45 mg/dL (ref >39)
LDL Chol Calc (NIH): 169 mg/dL — ABNORMAL HIGH (ref 0–99)
Triglycerides: 94 mg/dL (ref 0–149)
VLDL Cholesterol Cal: 17 mg/dL (ref 5–40)

## 2020-10-10 NOTE — Patient Instructions (Signed)
Medication Instructions:  Your physician recommends that you continue on your current medications as directed. Please refer to the Current Medication list given to you today.  *If you need a refill on your cardiac medications before your next appointment, please call your pharmacy*   Lab Work: Your physician recommends that you have labs drawn today: Lipid/liver profile  If you have labs (blood work) drawn today and your tests are completely normal, you will receive your results only by: Marland Kitchen MyChart Message (if you have MyChart) OR . A paper copy in the mail If you have any lab test that is abnormal or we need to change your treatment, we will call you to review the results.   Testing/Procedures: Your physician has requested that you have a lower extremity arterial duplex. This test is an ultrasound of the arteries in the legs. It looks at arterial blood flow in the legs. Allow one hour for Lower Arterial scans. There are no restrictions or special instructions  Your physician has requested that you have an ankle brachial index (ABI). During this test an ultrasound and blood pressure cuff are used to evaluate the arteries that supply the arms and legs with blood. Allow thirty minutes for this exam. There are no restrictions or special instructions.  Dr. [name] has recommended that you have an Ultrasound of your AORTA/IVC/ILIACS.   To prepare for this test:  . No food after 11PM the night before. Water is OK. (Don't drink liquids if you have been instructed not to for ANOTHER test).  . Avoid foods that produce bowel gas, for 24 hours prior to exam (see below). . No breakfast, no chewing gum, no smoking or carbonated beverages. . Patient may take morning medications with water. . Come in for test at least 15 minutes early to register.  These procedures are done at [address] [facility] 2nd Floor  Follow-Up: At [facility], you and your health needs are our priority.  As part of our  continuing mission to provide you with exceptional heart care, we have created designated Provider Care Teams.  These Care Teams include your primary Cardiologist (physician) and Advanced Practice Providers (APPs -  Physician Assistants and Nurse Practitioners) who all work together to provide you with the care you need, when you need it.  We recommend signing up for the patient portal called "MyChart".  Sign up information is provided on this After Visit Summary.  MyChart is used to connect with patients for Virtual Visits (Telemedicine).  Patients are able to view lab/test results, encounter notes, upcoming appointments, etc.  Non-urgent messages can be sent to your provider as well.   To learn more about what you can do with MyChart, go to [email or web address].    Your next appointment:   No future appointments made at this time. We will see you on an as needed basis.  Provider:   Nanetta Batty, MD

## 2020-10-10 NOTE — Progress Notes (Signed)
10/10/2020 Adam Meyer   11-24-1970  [id number]  Primary Physician Pcp, No Primary Cardiologist: Runell Gess MD Roseanne Reno  HPI:  Adam Meyer is a 50 y.o. 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  He was referred to me by Dr. Prince Rome, his PCP, for peripheral vascular valuation because of left lower extremity claudication.  He basically has no cardiac risk factors.  He does does not smoke.  There is no family history Adam Meyer is never had a heart attack or stroke.  I did do cardiac catheterization on him 07/11/2007 revealing normal coronary arteries and LV function.  He did have claudication and bilateral iliac disease.  I performed PTA and stenting of his iliac bifurcation using "kissing stent technique 07/11/2007 with excellent result.  Over the last year he is noticed left lower extremity claudication.  He is fairly active.  We will check lower extremity arterial Doppler studies.   Current Meds  Medication Sig  . aspirin 81 MG chewable tablet Chew 81 mg by mouth daily.  Marland Kitchen ibuprofen (ADVIL) 200 MG tablet Take 200 mg by mouth every 6 (six) hours as needed for mild pain.  . Multiple Vitamin (MULTIVITAMIN WITH MINERALS) TABS tablet Take 1 tablet by mouth daily.  . [DISCONTINUED] baclofen (LIORESAL) 10 MG tablet Take 0.5-1 tablets (5-10 mg total) by mouth 3 (three) times daily as needed for muscle spasms.  . [DISCONTINUED] predniSONE (DELTASONE) 10 MG tablet Take as directed for 12 days.  Daily dose 6,6,5,5,4,4,3,3,2,2,1,1.     No Known Allergies  Social History   Socioeconomic History  . Marital status: Divorced    Spouse name: Not on file  . Number of children: Not on file  . Years of education: Not on file  . Highest education level: Not on file  Occupational History  . Not on file  Tobacco Use  . Smoking status: Never Smoker  . Smokeless tobacco: Never Used  Substance and Sexual Activity  .  Alcohol use: Not Currently  . Drug use: Yes    Comment: WEEKLY  . Sexual activity: Yes    Partners: Female  Other Topics Concern  . Not on file  Social History Narrative  . Not on file   Social Determinants of Health   Financial Resource Strain: Not on file  Food Insecurity: Not on file  Transportation Needs: Not on file  Physical Activity: Not on file  Stress: Not on file  Social Connections: Not on file  Intimate Partner Violence: Not on file     Review of Systems: General: negative for chills, fever, night sweats or weight changes.  Cardiovascular: negative for chest pain, dyspnea on exertion, edema, orthopnea, palpitations, paroxysmal nocturnal dyspnea or shortness of breath Dermatological: negative for rash Respiratory: negative for cough or wheezing Urologic: negative for hematuria Abdominal: negative for nausea, vomiting, diarrhea, bright red blood per rectum, melena, or hematemesis Neurologic: negative for visual changes, syncope, or dizziness All other systems reviewed and are otherwise negative except as noted above.    Blood pressure 130/71, pulse 93, height 6\' 3"  (1.905 m), weight 210 lb 12.8 oz (95.6 kg), SpO2 91 %.  General appearance: alert and no distress Neck: no adenopathy, no carotid bruit, no JVD, supple, symmetrical, trachea midline and thyroid not enlarged, symmetric, no tenderness/mass/nodules Lungs: clear to auscultation bilaterally Heart: regular rate and rhythm, S1, S2 normal, no murmur, click, rub or gallop Extremities: extremities  normal, atraumatic, no cyanosis or edema Pulses: 2+ and symmetric Skin: Skin color, texture, turgor normal. No rashes or lesions Neurologic: Alert and oriented X 3, normal strength and tone. Normal symmetric reflexes. Normal coordination and gait  EKG sinus rhythm at 93 without ST or T wave changes.  Personally reviewed this EKG.  ASSESSMENT AND PLAN:   Peripheral arterial disease [facility]) Mr. Boettcher had bilateral  iliac artery PTA and stenting using "kissing stent technique by myself 07/11/2007.  He has complained of some left lower extremity claudication.  We will check aortoiliac and lower extremity arterial Doppler studies.      Runell Gess MD FACP,FACC,FAHA, [facility] 10/10/2020 10:44 AM

## 2020-10-10 NOTE — Assessment & Plan Note (Signed)
Adam Meyer had bilateral iliac artery PTA and stenting using "kissing stent technique by myself 07/11/2007.  He has complained of some left lower extremity claudication.  We will check aortoiliac and lower extremity arterial Doppler studies.

## 2020-10-16 ENCOUNTER — Other Ambulatory Visit: Payer: Self-pay

## 2020-10-16 ENCOUNTER — Encounter: Payer: Self-pay | Admitting: Rehabilitative and Restorative Service Providers"

## 2020-10-16 ENCOUNTER — Ambulatory Visit (INDEPENDENT_AMBULATORY_CARE_PROVIDER_SITE_OTHER): Payer: 59 | Admitting: Rehabilitative and Restorative Service Providers"

## 2020-10-16 DIAGNOSIS — R293 Abnormal posture: Secondary | ICD-10-CM

## 2020-10-16 DIAGNOSIS — R600 Localized edema: Secondary | ICD-10-CM

## 2020-10-16 DIAGNOSIS — M5412 Radiculopathy, cervical region: Secondary | ICD-10-CM

## 2020-10-16 DIAGNOSIS — M6281 Muscle weakness (generalized): Secondary | ICD-10-CM

## 2020-10-16 NOTE — Therapy (Signed)
[facility] Physical Therapy [address] [place], [place], [address] Phone: [phone]   Fax:  [phone]  Physical Therapy Treatment  Patient Details  Name: Adam Meyer MRN: [id number] Date of Birth: 07/26/1970 Referring Provider (PT): Lavada Mesi MD   Encounter Date: 10/16/2020   PT End of Session - 10/16/20 1650    Visit Number 2    Number of Visits 16    Date for PT Re-Evaluation 11/25/20    PT Start Time 1434    PT Stop Time 1515    PT Time Calculation (min) 41 min    Activity Tolerance Patient tolerated treatment well;No increased pain    Behavior During Therapy [facility] for tasks assessed/performed           History reviewed. No pertinent past medical history.  History reviewed. No pertinent surgical history.  There were no vitals filed for this visit.   Subjective Assessment - 10/16/20 1646    Subjective Marshun reports better postural awareness.  He is taking more frequent breaks during the day to relieve symptoms and do exercises.    Pertinent History OA lumbar spine, peripheral vascular disease    Limitations Sitting;Reading;House hold activities;Lifting    How long can you sit comfortably? 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    Currently in Pain? Yes    Pain Score 3     Pain Location Arm    Pain Orientation Right    Pain Descriptors / Indicators Aching;Sore    Pain Type Chronic pain    Pain Radiating Towards Mostly above the R elbow    Pain Onset More than a month ago    Pain Frequency Intermittent    Aggravating Factors  Working on cars in awkward, flexed postures    Pain Relieving Factors NA    Effect of Pain on Daily Activities Has to pace himself at work (self-employed) and unable to fully participate in activities with  his son.    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 Exercises: Machines for Strengthening   UBE (Upper Arm Bike) Pull only :20 pull/:20 rest for 5 minutes      Neck Exercises: Theraband   Scapula Retraction 20 reps;Blue    Shoulder Extension 10 reps;Red    Shoulder Extension Limitations Palms up with scapular squeeze    Shoulder External Rotation 10 reps;Green;Limitations    Shoulder External Rotation Limitations 2 sets with slow eccentrics      Neck Exercises: Standing   Thumb Tacks Thumb up the back stretch 10X 10 seconds B    Other Standing Exercises Shoulder blade pinches 10X 5 seconds    Other Standing Exercises Pull to  chest 20X at 55#      Neck Exercises: Seated   Cervical Isometrics Extension;10 reps;5 secs      Neck Exercises: Prone   Other Prone Exercise Prone shoulder 90 degrees thumbs up 2 sets of 10 for 3 seconds                  PT Education - 10/16/20 1648    Education Details Reviewed some work-specific mechanics and his HEP with progressions amde for scapular and postural strength.  Added a stretch for shoulder IR to address B impingement.    Person(s) Educated Patient    Methods Explanation;Demonstration;Verbal cues;Handout    Comprehension Verbal cues required;Need further instruction;Returned demonstration;Verbalized understanding            PT Short Term Goals - 10/16/20 1650      PT SHORT TERM GOAL #1   Title Shia will be independent with his starter HEP.    Time 4    Period Weeks     Status Achieved    Target Date 10/28/20             PT Long Term Goals - 10/16/20 1650      PT LONG TERM GOAL #1   Title Improve FOTO score to 67.    Baseline 52    Time 8    Period Weeks    Status On-going      PT LONG TERM GOAL #2   Title Improve R arm and hand pain to 0-2/10 (can be 5+/10) on the Numeric Pain Rating Scale.    Baseline 5+/10    Time 8    Period Weeks    Status On-going      PT LONG TERM GOAL #3   Title Improve cervical strength for extension to 40 pounds and lateral bending to 25 pounds.    Baseline Under 20 pounds for all.    Time 8    Period Weeks    Status On-going      PT LONG TERM GOAL #4   Title Triston will be independnet with his long-term HEP at [place].    Time 8    Period Weeks    Status On-going                 Plan - 10/16/20 1651    Clinical Impression Statement Atwood reports good early HEP compliance.  He reports good early postural awareness and he is modifying his activities to avoid flaring-up his R UE radicular symptoms.  We progressed his strength today and added a shoulder IR stretch to help with B shoulder impingement (in addition to his cervical radiculopathy).  Continue supervised PT to allow him to return to full work and recreational function.    Personal Factors and Comorbidities Time since onset of injury/illness/exacerbation    Examination-Activity Limitations Sleep;Lift;Bend;Carry;Reach Overhead    Examination-Participation Restrictions Interpersonal Relationship;Occupation;Community Activity    Stability/Clinical Decision Making Stable/Uncomplicated    Rehab Potential Good    PT Frequency 2x / week    PT Duration 8 weeks    PT Treatment/Interventions ADLs/Self Care Home Management;Moist Heat;Cryotherapy;Electrical Stimulation;Traction;Therapeutic activities;Therapeutic exercise;Neuromuscular re-education;Patient/family education;Manual techniques;Dry needling    PT Next Visit Plan Cervical traction,  postural/scapular/cervical strength    PT Home Exercise Plan Access Code: [email or web address]    Consulted and Agree with Plan of Care Patient           Patient will benefit from skilled therapeutic intervention in order to improve the following deficits and  impairments:  Decreased activity tolerance,Decreased endurance,Decreased range of motion,Decreased strength,Increased edema,Impaired UE functional use,Postural dysfunction,Improper body mechanics,Pain  Visit Diagnosis: Radiculopathy, cervical region  Abnormal posture  Localized edema  Muscle weakness (generalized)     Problem List Patient Active Problem List   Diagnosis Date Noted  . Peripheral arterial disease (HCC) 10/10/2020    Cherlyn Cushing PT, MPT 10/16/2020, 4:55 PM  [facility] Physical Therapy [address] [place], [place], [address] Phone: [phone]   Fax:  [phone]  Name: ANTHONYJAMES BARGAR MRN: [id number] Date of Birth: [birth year]

## 2020-10-16 NOTE — Patient Instructions (Signed)
Access Code: [email or web address] URL: [email or web address] Date: 10/16/2020 Prepared by: Pauletta Browns  Exercises Standing Scapular Retraction - 5 x daily - 7 x weekly - 1 sets - 5 reps - 5 second hold Standing Isometric Cervical Extension with Manual Resistance - 3-5 x daily - 7 x weekly - 1 sets - 5 reps - 5 seconds hold Shoulder External Rotation with Anchored Resistance with Towel Under Elbow - 1 x daily - 3 x weekly - 2 sets - 10 reps - 3 hold Shoulder extension with resistance - Neutral - 1 x daily - 3 x weekly - 2 sets - 10 reps - 3 seconds hold Scapular Retraction with Resistance - 1 x daily - 3 x weekly - 2 sets - 20 reps - 3 seconds hold Prone Shoulder Horizontal Abduction with Thumbs Up - 1 x daily - 3 x weekly - 1-2 sets - 10 reps - 3 seconds hold Standing Shoulder Internal Rotation Stretch with Hands Behind Back - 2 x daily - 7 x weekly - 1 sets - 10 reps - 10 seconds hold

## 2020-10-23 ENCOUNTER — Encounter: Payer: Self-pay | Admitting: Rehabilitative and Restorative Service Providers"

## 2020-10-23 ENCOUNTER — Other Ambulatory Visit: Payer: Self-pay

## 2020-10-23 ENCOUNTER — Ambulatory Visit (INDEPENDENT_AMBULATORY_CARE_PROVIDER_SITE_OTHER): Payer: 59 | Admitting: Rehabilitative and Restorative Service Providers"

## 2020-10-23 DIAGNOSIS — M6281 Muscle weakness (generalized): Secondary | ICD-10-CM | POA: Diagnosis not present

## 2020-10-23 DIAGNOSIS — R600 Localized edema: Secondary | ICD-10-CM

## 2020-10-23 DIAGNOSIS — R293 Abnormal posture: Secondary | ICD-10-CM

## 2020-10-23 DIAGNOSIS — M5412 Radiculopathy, cervical region: Secondary | ICD-10-CM | POA: Diagnosis not present

## 2020-10-23 NOTE — Therapy (Signed)
[facility] [address] [place], [place], [address] Phone: [phone]   Fax:  [phone]  Physical Therapy Treatment  Patient Details  Name: MOUSA PROUT MRN: [id number] Date of Birth: [birth year] Referring Provider (PT): Lavada Mesi MD   Encounter Date: 10/23/2020   PT End of Session - 10/23/20 1532    Visit Number 3    Number of Visits 16    Date for PT Re-Evaluation 11/25/20    PT Start Time 1439    PT Stop Time 1519    PT Time Calculation (min) 40 min    Activity Tolerance Patient tolerated treatment well;No increased pain    Behavior During Therapy [facility] for tasks assessed/performed           History reviewed. No pertinent past medical history.  History reviewed. No pertinent surgical history.  There were no vitals filed for this visit.   Subjective Assessment - 10/23/20 1526    Subjective Will reports minimal R arm peripheral pain over the past week.  He is getting back into his regular workouts with his son.    Pertinent History OA lumbar spine, peripheral vascular disease    Limitations Sitting;Reading;House hold activities;Lifting    How long can you sit comfortably? 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    Currently in Pain? No/denies    Pain Location Arm    Pain Orientation Right    Pain Descriptors / Indicators Sore    Pain Type Chronic pain    Pain Radiating Towards Was to the elbow    Pain Onset More than a month ago    Pain Frequency Intermittent    Aggravating Factors  Awkward, flexed postures required when working on cars    Pain Relieving Factors NA    Effect of Pain on Daily Activities Has to turn down work (self-employed) to avoid overuse and flaring-up his symptoms.  Can't fully participate in  activities with his son.    Multiple Pain Sites No              [facility] PT Assessment - 10/23/20 0001      Strength   Cervical Extension --   27.8 pounds (was 19.7)                        [facility] Adult PT Treatment/Exercise - 10/23/20 0001      Posture/Postural Control   Posture/Postural Control Postural limitations    Postural Limitations Forward head;Rounded Shoulders;Decreased lumbar lordosis      Therapeutic Activites    Therapeutic Activities Work Counselling psychologist;Other Therapeutic Activities    Work Simulation Discussed workouts at home with bowflex, to avoid holding in a sneeze and to avoid flexing when sneezing or coughing.  Also discussed adding walking 3X/week for 20+ minutes to his HEP.      Exercises   Exercises Neck      Neck Exercises: Machines for Strengthening   UBE (Upper Arm Bike) Pull only :20 pull/:20 rest for 5 minutes      Neck Exercises: Theraband   Scapula Retraction 20 reps;Blue    Shoulder Extension 10 reps;Red    Shoulder Extension Limitations Palms up with scapular squeeze    Shoulder External Rotation 10  reps;Green;Limitations    Shoulder External Rotation Limitations 2 sets with slow eccentrics      Neck Exercises: Standing   Thumb Tacks Thumb up the back stretch 10X 10 seconds B    Other Standing Exercises Shoulder blade pinches 10X 5 seconds    Other Standing Exercises Pull to chest 20X at 55#      Neck Exercises: Seated   Cervical Isometrics Extension;10 reps;5 secs      Neck Exercises: Prone   Other Prone Exercise Prone shoulder 90 degrees thumbs up 2 sets of 10 for 3 seconds                  PT Education - 10/23/20 1530    Education Details Discussed several workout recommendations (walking and bowflex specific), reviewed mechanics and recommended he avoid flexion and holding in a sneeze or cough.    Person(s) Educated Patient    Methods Explanation;Demonstration;Verbal cues    Comprehension Verbalized  understanding;Need further instruction;Returned demonstration;Verbal cues required            PT Short Term Goals - 10/23/20 1531      PT SHORT TERM GOAL #1   Title Rhydian will be independent with his starter HEP.    Time 4    Period Weeks    Status Achieved    Target Date 10/28/20             PT Long Term Goals - 10/23/20 1531      PT LONG TERM GOAL #1   Title Improve FOTO score to 67.    Baseline 60 (was 52)    Time 8    Period Weeks    Status On-going      PT LONG TERM GOAL #2   Title Improve R arm and hand pain to 0-2/10 (can be 5+/10) on the Numeric Pain Rating Scale.    Baseline 5+/10    Time 8    Period Weeks    Status Achieved      PT LONG TERM GOAL #3   Title Improve cervical strength for extension to 40 pounds and lateral bending to 25 pounds.    Baseline Under 20 pounds for all at evaluation, better at 10/23/2020 assessment (27.8 pounds)    Time 8    Period Weeks    Status On-going      PT LONG TERM GOAL #4   Title Aaric will be independnet with his long-term HEP at [place].    Time 8    Period Weeks    Status On-going                 Plan - 10/23/20 1532    Clinical Impression Statement Will notes significant progress in just 3 PT visits.  He has been paying attention to his body mechanics and incorporating PT activities into his modified bowflex workout.  Cervical strength and FOTO showed objective progress today vs evaluation.  Continue POC to meet all LTGs.    Personal Factors and Comorbidities Time since onset of injury/illness/exacerbation    Examination-Activity Limitations Sleep;Lift;Bend;Carry;Reach Overhead    Examination-Participation Restrictions Interpersonal Relationship;Occupation;Community Activity    Stability/Clinical Decision Making Stable/Uncomplicated    Rehab Potential Good    PT Frequency 2x / week    PT Duration 8 weeks    PT Treatment/Interventions ADLs/Self Care Home Management;Moist Heat;Cryotherapy;Electrical  Stimulation;Traction;Therapeutic activities;Therapeutic exercise;Neuromuscular re-education;Patient/family education;Manual techniques;Dry needling    PT Next Visit Plan Postural/scapular/cervical strength    PT Home Exercise Plan Access  Code: [email or web address]    Consulted and Agree with Plan of Care Patient           Patient will benefit from skilled therapeutic intervention in order to improve the following deficits and impairments:  Decreased activity tolerance,Decreased endurance,Decreased range of motion,Decreased strength,Increased edema,Impaired UE functional use,Postural dysfunction,Improper body mechanics,Pain  Visit Diagnosis: Radiculopathy, cervical region  Abnormal posture  Localized edema  Muscle weakness (generalized)     Problem List Patient Active Problem List   Diagnosis Date Noted  . Peripheral arterial disease (HCC) 10/10/2020    Cherlyn Cushing PT, MPT 10/23/2020, 3:35 PM  [facility] [address] [place], [place], [address] Phone: [phone]   Fax:  [phone]  Name: TILAK OAKLEY MRN: [id number] Date of Birth: [birth year]

## 2020-10-30 ENCOUNTER — Other Ambulatory Visit: Payer: Self-pay

## 2020-10-30 ENCOUNTER — Encounter: Payer: Self-pay | Admitting: Rehabilitative and Restorative Service Providers"

## 2020-10-30 ENCOUNTER — Ambulatory Visit (INDEPENDENT_AMBULATORY_CARE_PROVIDER_SITE_OTHER): Payer: 59 | Admitting: Rehabilitative and Restorative Service Providers"

## 2020-10-30 DIAGNOSIS — R600 Localized edema: Secondary | ICD-10-CM

## 2020-10-30 DIAGNOSIS — R293 Abnormal posture: Secondary | ICD-10-CM

## 2020-10-30 DIAGNOSIS — M5412 Radiculopathy, cervical region: Secondary | ICD-10-CM

## 2020-10-30 DIAGNOSIS — M6281 Muscle weakness (generalized): Secondary | ICD-10-CM

## 2020-10-30 NOTE — Therapy (Signed)
[facility] [address] [place], [place], [address] Phone: [phone]   Fax:  [phone]  Physical Therapy Treatment  Patient Details  Name: Adam Meyer MRN: [id number] Date of Birth: 03-Sep-1970 Referring Provider (PT): Lavada Mesi MD   Encounter Date: 10/30/2020   PT End of Session - 10/30/20 1508    Visit Number 4    Number of Visits 16    Date for PT Re-Evaluation 11/25/20    PT Start Time 1433    PT Stop Time 1517    PT Time Calculation (min) 44 min    Activity Tolerance Patient tolerated treatment well;No increased pain    Behavior During Therapy [facility] for tasks assessed/performed           History reviewed. No pertinent past medical history.  History reviewed. No pertinent surgical history.  There were no vitals filed for this visit.   Subjective Assessment - 10/30/20 1441    Subjective Adam Meyer reports less frequent and less severe R UE flare-ups over the past week.    Pertinent History OA lumbar spine, peripheral vascular disease    Limitations Sitting;Reading;House hold activities;Lifting    How long can you sit comfortably? 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    Currently in Pain? Yes    Pain Score 3     Pain Location Arm    Pain Orientation Right    Pain Descriptors / Indicators Heaviness    Pain Type Chronic pain    Pain Radiating Towards To above the elbow    Pain Onset More than a month ago    Pain Frequency Intermittent    Aggravating Factors  Awkward flexed postures required when working on cars    Pain Relieving Factors NA    Effect of Pain on Daily Activities Has to turn down work (self-employed) to avoid overuse and flaring-up his symptoms.  Can't fully participate in activities with his son.     Multiple Pain Sites No                             [facility] Adult PT Treatment/Exercise - 10/30/20 0001      Posture/Postural Control   Posture/Postural Control Postural limitations    Postural Limitations Forward head;Rounded Shoulders;Decreased lumbar lordosis      Therapeutic Activites    Therapeutic Activities Work Counselling psychologist;Other Therapeutic Activities      Exercises   Exercises Neck      Neck Exercises: Theraband   Scapula Retraction 20 reps;Blue    Shoulder Extension 10 reps;Red    Shoulder Extension Limitations Palms up with scapular squeeze    Shoulder External Rotation 10 reps;Green;Limitations    Shoulder External Rotation Limitations 2 sets with slow eccentrics      Neck Exercises: Standing   Thumb Tacks Thumb up the back stretch 10X 10 seconds B    Other Standing Exercises Shoulder blade pinches 10X 5 seconds and standing trunk extension AROM 10X 3 seconds    Other Standing Exercises Pull to chest 20X at 55#      Neck Exercises: Seated   Cervical Isometrics Extension;10 reps;5 secs      Neck Exercises: Prone   Other Prone Exercise  Prone shoulder 90 degrees thumbs up and prone B arm and leg extensions (toes back and palms facing in) 2 sets of 10 for 3 seconds                  PT Education - 10/30/20 1503    Education Details Reviewed HEP.  Made some progressions to incorporate more low back strengthening per Adam Meyer's request.    Person(s) Educated Patient    Methods Explanation;Demonstration;Verbal cues;Handout    Comprehension Verbalized understanding;Returned demonstration;Need further instruction;Verbal cues required            PT Short Term Goals - 10/30/20 1504      PT SHORT TERM GOAL #1   Title Adam Meyer Adam Meyer be independent with his starter HEP.    Time 4    Period Weeks    Status Achieved    Target Date 10/28/20             PT Long Term Goals - 10/30/20 1504      PT LONG TERM GOAL #1   Title Improve FOTO score to 67.     Baseline 60 (was 52)    Time 8    Period Weeks    Status On-going      PT LONG TERM GOAL #2   Title Improve R arm and hand pain to 0-2/10 (can be 5+/10) on the Numeric Pain Rating Scale.    Baseline 5+/10 at evaluation, currently 4/10 at worst    Time 8    Period Weeks    Status On-going      PT LONG TERM GOAL #3   Title Improve cervical strength for extension to 40 pounds and lateral bending to 25 pounds.    Baseline Under 20 pounds for all at evaluation, better at 10/23/2020 assessment (27.8 pounds)    Time 8    Period Weeks    Status On-going      PT LONG TERM GOAL #4   Title Adam Meyer Adam Meyer be independnet with his long-term HEP at [place].    Time 8    Period Weeks    Status On-going                 Plan - 10/30/20 1621    Clinical Impression Statement Adam Meyer is pleased with his early progress with PT.  R UE peripheral symptoms are less frequent and severe as compared to evaluation.  He had questions about his low back and we modified some activities to address low back strength along with general postural and spine strength to help with this.    Personal Factors and Comorbidities Time since onset of injury/illness/exacerbation    Examination-Activity Limitations Sleep;Lift;Bend;Carry;Reach Overhead    Examination-Participation Restrictions Interpersonal Relationship;Occupation;Community Activity    Stability/Clinical Decision Making Stable/Uncomplicated    Rehab Potential Good    PT Frequency 2x / week    PT Duration 8 weeks    PT Treatment/Interventions ADLs/Self Care Home Management;Moist Heat;Cryotherapy;Electrical Stimulation;Traction;Therapeutic activities;Therapeutic exercise;Neuromuscular re-education;Patient/family education;Manual techniques;Dry needling    PT Next Visit Plan Postural/scapular/cervical strength    PT Home Exercise Plan Access Code: [email or web address]    Consulted and Agree with Plan of Care Patient           Patient Adam Meyer benefit from skilled  therapeutic intervention in order to improve the following deficits and impairments:  Decreased activity tolerance,Decreased endurance,Decreased range of motion,Decreased strength,Increased edema,Impaired UE functional use,Postural dysfunction,Improper body mechanics,Pain  Visit Diagnosis: Radiculopathy, cervical region  Abnormal posture  Localized edema  Muscle weakness (generalized)     Problem List Patient Active Problem List   Diagnosis Date Noted  . Peripheral arterial disease (HCC) 10/10/2020    Cherlyn Cushing PT, MPT 10/30/2020, 4:23 PM  [facility] [address] [place], [place], [address] Phone: [phone]   Fax:  [phone]  Name: JOCELYN LOWERY MRN: [id number] Date of Birth: [birth year]

## 2020-11-03 ENCOUNTER — Ambulatory Visit (HOSPITAL_COMMUNITY)
Admission: RE | Admit: 2020-11-03 | Payer: 59 | Source: Ambulatory Visit | Attending: Cardiovascular Disease | Admitting: Cardiovascular Disease

## 2020-11-06 ENCOUNTER — Other Ambulatory Visit: Payer: Self-pay

## 2020-11-06 ENCOUNTER — Ambulatory Visit (INDEPENDENT_AMBULATORY_CARE_PROVIDER_SITE_OTHER): Payer: 59 | Admitting: Rehabilitative and Restorative Service Providers"

## 2020-11-06 ENCOUNTER — Encounter: Payer: Self-pay | Admitting: Rehabilitative and Restorative Service Providers"

## 2020-11-06 DIAGNOSIS — R600 Localized edema: Secondary | ICD-10-CM | POA: Diagnosis not present

## 2020-11-06 DIAGNOSIS — M5412 Radiculopathy, cervical region: Secondary | ICD-10-CM | POA: Diagnosis not present

## 2020-11-06 DIAGNOSIS — M6281 Muscle weakness (generalized): Secondary | ICD-10-CM

## 2020-11-06 DIAGNOSIS — R293 Abnormal posture: Secondary | ICD-10-CM | POA: Diagnosis not present

## 2020-11-06 NOTE — Therapy (Signed)
[facility] [address] [place], [place], [address] Phone: [phone]   Fax:  [phone]  Physical Therapy Treatment/Reassessment  Patient Details  Name: Adam Meyer MRN: [id number] Date of Birth: [birth year] Referring Provider (PT): Eunice Blase MD   Encounter Date: 11/06/2020   PT End of Session - 11/06/20 1522    Visit Number 5    Number of Visits 16    Date for PT Re-Evaluation 11/25/20    PT Start Time 1430    PT Stop Time 1515    PT Time Calculation (min) 45 min    Activity Tolerance Patient tolerated treatment well;No increased pain    Behavior During Therapy [facility] for tasks assessed/performed           History reviewed. No pertinent past medical history.  History reviewed. No pertinent surgical history.  There were no vitals filed for this visit.   Subjective Assessment - 11/06/20 1436    Subjective Will reports no R UE pain over the the past week.  His low back has been the most problematic.  R shoulder is significantly improved.    Pertinent History OA lumbar spine, peripheral vascular disease    Limitations Sitting;Reading;House hold activities;Lifting    How long can you sit comfortably? 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    Currently in Pain? No/denies    Pain Score 0-No pain    Pain Location Arm    Pain Orientation Right    Pain Descriptors / Indicators Tightness    Pain Type Chronic pain    Pain Radiating Towards Nothing below the elbow    Pain Onset More than a month ago    Pain Frequency Intermittent    Aggravating Factors  Doing thumb up the back stretch    Pain Relieving Factors NA    Effect of Pain on Daily Activities Has to take frequent breaks to avoid overuse at work.  Trying to get back into full  workouts with his son.    Multiple Pain Sites No              [facility] PT Assessment - 11/06/20 0001      Strength   Cervical Extension --   40 pounds                        [facility] Adult PT Treatment/Exercise - 11/06/20 0001      Posture/Postural Control   Posture/Postural Control Postural limitations    Postural Limitations Forward head;Rounded Shoulders;Decreased lumbar lordosis      Exercises   Exercises Neck      Neck Exercises: Machines for Strengthening   UBE (Upper Arm Bike) Pull only :20 pull/:20 rest for 5 minutes      Neck Exercises: Theraband   Scapula Retraction 20 reps;Blue    Shoulder Extension 10 reps;Red    Shoulder Extension Limitations Palms up with scapular squeeze    Shoulder External Rotation 10 reps;Green;Limitations    Shoulder External Rotation Limitations 2 sets with slow eccentrics      Neck Exercises: Standing   Thumb Tacks Thumb up the back stretch 10X 10 seconds B    Other Standing Exercises Shoulder blade pinches 10X 5 seconds and standing trunk extension  AROM 10X 3 seconds    Other Standing Exercises Pull to chest 20X at 55#      Neck Exercises: Seated   Cervical Isometrics Extension;10 reps;5 secs      Neck Exercises: Prone   Other Prone Exercise Prone shoulder 90 degrees thumbs up and prone B arm and leg extensions (toes back and palms facing in) 2 sets of 10 for 3 seconds                  PT Education - 11/06/20 1510    Education Details Reviewed and progressed prone strength with HEP.    Person(s) Educated Patient    Methods Demonstration;Verbal cues    Comprehension Verbalized understanding;Returned demonstration;Verbal cues required;Need further instruction            PT Short Term Goals - 11/06/20 1510      PT SHORT TERM GOAL #1   Title Kennen will be independent with his starter HEP.    Time 4    Period Weeks    Status Achieved    Target Date 10/28/20             PT Long Term Goals -  11/06/20 1511      PT LONG TERM GOAL #1   Title Improve FOTO score to 67.    Baseline 73 (was 52)    Time 8    Period Weeks    Status Achieved      PT LONG TERM GOAL #2   Title Improve R arm and hand pain to 0-2/10 (can be 5+/10) on the Numeric Pain Rating Scale.    Baseline 5+/10 at evaluation, currently 3/10 at worst    Time 8    Period Weeks    Status On-going      PT LONG TERM GOAL #3   Title Improve cervical strength for extension to 40 pounds and lateral bending to 25 pounds.    Baseline Under 40 pounds for all at evaluation, better at 11/06/2020 assessment    Time 8    Period Weeks    Status Achieved      PT LONG TERM GOAL #4   Title Amante will be independnet with his long-term HEP at [place].    Time 8    Period Weeks    Status Achieved                 Plan - 11/06/20 1522    Clinical Impression Statement Will has met most long-term goals established at evaluation.  Remaining pain appears to be shoulder impingement, not cervical nerve root as he has not had these symptoms in a week.  Will is going to attend 1 additional visit to review and update his HEP before being given the option of transfer into independent rehabilitation.    Personal Factors and Comorbidities Time since onset of injury/illness/exacerbation    Examination-Activity Limitations Sleep;Lift;Bend;Carry;Reach Overhead    Examination-Participation Restrictions Interpersonal Relationship;Occupation;Community Activity    Stability/Clinical Decision Making Stable/Uncomplicated    Rehab Potential Good    PT Frequency 2x / week    PT Duration 8 weeks    PT Treatment/Interventions ADLs/Self Care Home Management;Moist Heat;Cryotherapy;Electrical Stimulation;Traction;Therapeutic activities;Therapeutic exercise;Neuromuscular re-education;Patient/family education;Manual techniques;Dry needling    PT Next Visit Plan Review and update HEP.  DC?    PT Home Exercise Plan Access Code: [email or web address]    [email or web address] and  Agree with Plan of Care Patient           Patient will benefit  from skilled therapeutic intervention in order to improve the following deficits and impairments:  Decreased activity tolerance,Decreased endurance,Decreased range of motion,Decreased strength,Increased edema,Impaired UE functional use,Postural dysfunction,Improper body mechanics,Pain  Visit Diagnosis: Radiculopathy, cervical region  Abnormal posture  Localized edema  Muscle weakness (generalized)     Problem List Patient Active Problem List   Diagnosis Date Noted  . Peripheral arterial disease ([place]) 10/10/2020    Farley Ly PT, MPT 11/06/2020, 3:25 PM  [facility] Physical Therapy [address] [place], [place], [address] Phone: [phone]   Fax:  [phone]  Name: Adam Meyer MRN: [id number] Date of Birth: [birth year]

## 2020-11-19 ENCOUNTER — Ambulatory Visit (INDEPENDENT_AMBULATORY_CARE_PROVIDER_SITE_OTHER): Payer: 59 | Admitting: Rehabilitative and Restorative Service Providers"

## 2020-11-19 ENCOUNTER — Other Ambulatory Visit: Payer: Self-pay

## 2020-11-19 ENCOUNTER — Encounter: Payer: Self-pay | Admitting: Rehabilitative and Restorative Service Providers"

## 2020-11-19 DIAGNOSIS — M6281 Muscle weakness (generalized): Secondary | ICD-10-CM | POA: Diagnosis not present

## 2020-11-19 DIAGNOSIS — R293 Abnormal posture: Secondary | ICD-10-CM

## 2020-11-19 DIAGNOSIS — R600 Localized edema: Secondary | ICD-10-CM

## 2020-11-19 DIAGNOSIS — M5412 Radiculopathy, cervical region: Secondary | ICD-10-CM | POA: Diagnosis not present

## 2020-11-19 NOTE — Therapy (Signed)
[facility] Physical Therapy [address] [place], [place], [address] Phone: [phone]   Fax:  [phone]  Physical Therapy Treatment/Discharge   Patient Details  Name: Adam Meyer MRN: [id number] Date of Birth: Apr 29, 1971 Referring Provider (PT): Eunice Blase MD   Encounter Date: 11/19/2020   PHYSICAL THERAPY DISCHARGE SUMMARY  Visits from Start of Care: 6  Current functional level related to goals / functional outcomes: See note   Remaining deficits: See note   Education / Equipment: HEP Plan: Patient agrees to discharge.  Patient goals were met. Patient is being discharged due to meeting the stated rehab goals.  ?????        PT End of Session - 11/19/20 1541    Visit Number 6    Number of Visits 16    Date for PT Re-Evaluation 11/25/20    PT Start Time 1522    PT Stop Time 1550    PT Time Calculation (min) 28 min    Activity Tolerance Patient tolerated treatment well;No increased pain    Behavior During Therapy [facility] for tasks assessed/performed           History reviewed. No pertinent past medical history.  History reviewed. No pertinent surgical history.  There were no vitals filed for this visit.   Subjective Assessment - 11/19/20 1542    Subjective Pt. indicated Global Rating of change + 5-6 at this time.    Pertinent History OA lumbar spine, peripheral vascular disease    Limitations Sitting;Reading;House hold activities;Lifting    How long can you sit comfortably? 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    Pain Onset More than a month ago              [facility] PT Assessment - 11/19/20 0001      Assessment   Medical Diagnosis Rt cervical radiculopathy    Referring Provider (PT) Eunice Blase MD      Observation/Other  Assessments   Focus on Therapeutic Outcomes (FOTO)  updated 73%      AROM   Overall AROM Comments Cervical AROM WFL at this time without complaints.  Rt hand behind back T8                         [facility] Adult PT Treatment/Exercise - 11/19/20 0001      Exercises   Exercises Other Exercises    Other Exercises  HEP review, education (see Pt. instructions).  Additional time spent on review of progression of workout plan for whole body, swimming etc.      Neck Exercises: Machines for Strengthening   UBE (Upper Arm Bike) Pull only :20 pull/:20 rest for 5 minutes   Lvl 3.5                 PT Education - 11/19/20 1544    Education Details D/C HEP review    Person(s) Educated Patient    Methods Explanation;Demonstration;Verbal cues;Handout    Comprehension Returned demonstration            PT Short Term Goals - 11/06/20 1510      PT SHORT TERM GOAL #1   Title [place] will be independent with his starter HEP.    Time 4  Period Weeks    Status Achieved    Target Date 10/28/20             PT Long Term Goals - 11/19/20 1545      PT LONG TERM GOAL #1   Title Improve FOTO score to 67.    Baseline 73 (was 52)    Time 8    Period Weeks    Status Achieved      PT LONG TERM GOAL #2   Title Improve R arm and hand pain to 0-2/10 (can be 5+/10) on the Numeric Pain Rating Scale.    Time 8    Period Weeks    Status Achieved      PT LONG TERM GOAL #3   Title Improve cervical strength for extension to 40 pounds and lateral bending to 25 pounds.    Time 8    Period Weeks    Status Achieved      PT LONG TERM GOAL #4   Title [place] will be independnet with his long-term HEP at [place].    Time 8    Period Weeks    Status Achieved                 Plan - 11/19/20 1547    Clinical Impression Statement Pt. has attended 6 visits and demonstrated good progress overall with Global rating of change +5 or +6 at this time.  See objective data for updated  information for current presentation.  Pt. is appropriate for d/c to HEP at this time based off information and Pt. is in agreement.    Personal Factors and Comorbidities Time since onset of injury/illness/exacerbation    Examination-Activity Limitations Sleep;Lift;Bend;Carry;Reach Overhead    Examination-Participation Restrictions Interpersonal Relationship;Occupation;Community Activity    Stability/Clinical Decision Making Stable/Uncomplicated    Rehab Potential Good    PT Treatment/Interventions ADLs/Self Care Home Management;Moist Heat;Cryotherapy;Electrical Stimulation;Traction;Therapeutic activities;Therapeutic exercise;Neuromuscular re-education;Patient/family education;Manual techniques;Dry needling    PT Next Visit Plan D/C to HEP    PT Home Exercise Plan Access Code: [email or web address]    Consulted and Agree with Plan of Care Patient           Patient will benefit from skilled therapeutic intervention in order to improve the following deficits and impairments:  Decreased activity tolerance,Decreased endurance,Decreased range of motion,Decreased strength,Increased edema,Impaired UE functional use,Postural dysfunction,Improper body mechanics,Pain  Visit Diagnosis: Radiculopathy, cervical region  Abnormal posture  Localized edema  Muscle weakness (generalized)     Problem List Patient Active Problem List   Diagnosis Date Noted  . Peripheral arterial disease (HCC) 10/10/2020    Michael Wright, PT, DPT, OCS, ATC 11/19/20  3:51 PM    [facility] [address] [place], [place], [address] Phone: [phone]   Fax:  [phone]  Name: Adam Meyer MRN: [id number] Date of Birth: 08/16/1970   

## 2020-11-19 NOTE — Patient Instructions (Signed)
Access Code: [email or web address] URL: [email or web address] Date: 11/19/2020 Prepared by: Chyrel Masson  Exercises Standing Scapular Retraction - 5 x daily - 7 x weekly - 1 sets - 5 reps - 5 second hold Standing Isometric Cervical Extension with Manual Resistance - 3-5 x daily - 7 x weekly - 1 sets - 5 reps - 5 seconds hold Shoulder External Rotation with Anchored Resistance with Towel Under Elbow - 1 x daily - 3 x weekly - 2 sets - 10 reps - 3 hold Shoulder extension with resistance - Neutral - 1 x daily - 3 x weekly - 2 sets - 10 reps - 3 seconds hold Scapular Retraction with Resistance - 1 x daily - 3 x weekly - 2 sets - 20 reps - 3 seconds hold Prone Shoulder Horizontal Abduction with Thumbs Up - 1 x daily - 3 x weekly - 1-2 sets - 10 reps - 3 seconds hold Standing Shoulder Internal Rotation Stretch with Hands Behind Back - 2 x daily - 7 x weekly - 1 sets - 10 reps - 10 seconds hold Standing Lumbar Extension at Wall - Forearms - 5 x daily - 7 x weekly - 1 sets - 5 reps - 3 seconds hold Full Superman on Table - 1 x daily - 7 x weekly - 1-2 sets - 10 reps - 3 seconds hold

## 2020-12-02 ENCOUNTER — Telehealth: Payer: Self-pay

## 2020-12-02 DIAGNOSIS — I739 Peripheral vascular disease, unspecified: Secondary | ICD-10-CM

## 2020-12-02 NOTE — Telephone Encounter (Signed)
-----   Message from Runell Gess, MD sent at 10/12/2020 10:03 AM EDT ----- LDL 170 not on statin Rx.  Start Atorva 40 mg and re check FLP 3 months. Heart Healthy diet. Need  LDL <70 for secondary prevention

## 2020-12-02 NOTE — Telephone Encounter (Signed)
Spoke with pt regarding lab results and starting statin therapy. Pt states that he is not ready to try a statin. Pt would like to attempt a heart healthy diet and exercise to reduce cholesterol levels. Pt would like to touch base with Dr. [name] in 3 months to see if there is improvement in cholesterol levels as well to discuss LEA dopplers. Pt states that he is in outpatient rehab for cervical nerve impingement. Will send pt heart healthy diet through mail. Recall made for office visit (pt was not ready to make appointment today when asked). Lab reminders to be sent as well.

## 2021-03-31 ENCOUNTER — Other Ambulatory Visit: Payer: Self-pay | Admitting: Cardiovascular Disease

## 2021-04-01 LAB — LIPID PANEL
Chol/HDL Ratio: 5.8 ratio — ABNORMAL HIGH (ref 0.0–5.0)
Cholesterol, Total: 214 mg/dL — ABNORMAL HIGH (ref 100–199)
HDL: 37 mg/dL — ABNORMAL LOW (ref >39)
LDL Chol Calc (NIH): 156 mg/dL — ABNORMAL HIGH (ref 0–99)
Triglycerides: 116 mg/dL (ref 0–149)
VLDL Cholesterol Cal: 21 mg/dL (ref 5–40)

## 2021-04-01 LAB — HEPATIC FUNCTION PANEL
ALT: 19 IU/L (ref 0–44)
AST: 29 IU/L (ref 0–40)
Albumin: 4.9 g/dL (ref 3.8–4.9)
Alkaline Phosphatase: 101 IU/L (ref 44–121)
Bilirubin Total: 0.4 mg/dL (ref 0.0–1.2)
Bilirubin, Direct: <0.1 mg/dL (ref 0.00–0.40)
Total Protein: 7.4 g/dL (ref 6.0–8.5)

## 2021-07-17 ENCOUNTER — Telehealth: Payer: Self-pay | Admitting: Cardiovascular Disease

## 2021-07-17 DIAGNOSIS — Z79899 Other long term (current) drug therapy: Secondary | ICD-10-CM

## 2021-07-17 NOTE — Telephone Encounter (Signed)
Patient states Dr berry is supposed to make changes to his medication. And he feels waiting till his next open which is 3/29 is too long. Please advise

## 2021-07-17 NOTE — Telephone Encounter (Signed)
-  Pt state based on 10/18 lab work, Dr. [name] recommended starting a statin to decrease LDL. -At that time pt state he preferred to work on diet and exercise prior to starting statin then repeat lab work. -Pt state he never received repeat lab slip or scheduled follow up appointment  -Repeat lipid ordered and lab slip placed in the mail -Appointment scheduled for 2/28

## 2021-08-08 LAB — HEPATIC FUNCTION PANEL
ALT: 21 IU/L (ref 0–44)
AST: 36 IU/L (ref 0–40)
Albumin: 4.4 g/dL (ref 3.8–4.9)
Alkaline Phosphatase: 102 IU/L (ref 44–121)
Bilirubin Total: 0.3 mg/dL (ref 0.0–1.2)
Bilirubin, Direct: <0.1 mg/dL (ref 0.00–0.40)
Total Protein: 6.9 g/dL (ref 6.0–8.5)

## 2021-08-08 LAB — LIPID PANEL
Chol/HDL Ratio: 4.4 ratio (ref 0.0–5.0)
Cholesterol, Total: 191 mg/dL (ref 100–199)
HDL: 43 mg/dL (ref >39)
LDL Chol Calc (NIH): 134 mg/dL — ABNORMAL HIGH (ref 0–99)
Triglycerides: 78 mg/dL (ref 0–149)
VLDL Cholesterol Cal: 14 mg/dL (ref 5–40)

## 2021-08-11 ENCOUNTER — Other Ambulatory Visit: Payer: Self-pay

## 2021-08-11 ENCOUNTER — Ambulatory Visit (INDEPENDENT_AMBULATORY_CARE_PROVIDER_SITE_OTHER): Payer: PRIVATE HEALTH INSURANCE | Admitting: Cardiovascular Disease

## 2021-08-11 ENCOUNTER — Encounter: Payer: Self-pay | Admitting: Cardiovascular Disease

## 2021-08-11 DIAGNOSIS — E782 Mixed hyperlipidemia: Secondary | ICD-10-CM | POA: Diagnosis not present

## 2021-08-11 DIAGNOSIS — E785 Hyperlipidemia, unspecified: Secondary | ICD-10-CM | POA: Insufficient documentation

## 2021-08-11 DIAGNOSIS — I739 Peripheral vascular disease, unspecified: Secondary | ICD-10-CM | POA: Diagnosis not present

## 2021-08-11 MED ORDER — ATORVASTATIN CALCIUM 40 MG PO TABS: 40.0000 mg | ORAL_TABLET | Freq: Every day | ORAL | 3 refills | Status: DC

## 2021-08-11 NOTE — Assessment & Plan Note (Signed)
Recent lipid profile performed 08/03/2021 revealed total cholesterol 191, LDL 134 and HDL 43.  He is not at goal for secondary prevention given his prior iliac stents.  I am going to start him on atorvastatin 40 mg a day and we will recheck a lipid liver profile in 3 months.

## 2021-08-11 NOTE — Progress Notes (Signed)
08/11/2021 Adam Meyer   04-10-1971  [id number]  Primary Physician Eunice Blase, MD Primary Cardiologist: Lorretta Harp MD Lupe Carney, [place]  HPI:  Adam Meyer is a 51 y.o.   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  He was referred to me by Dr. Junius Roads, his PCP, for peripheral vascular valuation because of left lower extremity claudication.  I last saw him in the office 10/10/2020.  He basically has no cardiac risk factors.  He does does not smoke.  There is no family history Gwenlyn Found is never had a heart attack or stroke.  I did do cardiac catheterization on him 07/11/2007 revealing normal coronary arteries and LV function.  He did have claudication and bilateral iliac disease.  I performed PTA and stenting of his iliac bifurcation using "kissing stent technique 07/11/2007 with excellent result.  Over the last year he is noticed left lower extremity claudication.    Since I saw him a year ago he continues to do well.  He does have chronic back pain somewhat improved with CBD and medical marijuana.  He denies chest pain or shortness of breath.  He does complain of some claudication however.  Recent lipid profile performed 08/07/2021 revealed total cholesterol 191, LDL 134 and HDL 43.  Current Meds  Medication Sig   aspirin 81 MG chewable tablet Chew 81 mg by mouth daily.   ibuprofen (ADVIL) 200 MG tablet Take 200 mg by mouth every 6 (six) hours as needed for mild pain.   Multiple Vitamin (MULTIVITAMIN WITH MINERALS) TABS tablet Take 1 tablet by mouth daily.     No Known Allergies  Social History   Socioeconomic History   Marital status: Divorced    Spouse name: Not on file   Number of children: Not on file   Years of education: Not on file   Highest education level: Not on file  Occupational History   Not on file  Tobacco Use   Smoking status: Never   Smokeless  tobacco: Never  Substance and Sexual Activity   Alcohol use: Not Currently   Drug use: Yes    Types: Marijuana    Comment: WEEKLY   Sexual activity: Yes    Partners: Female  Other Topics Concern   Not on file  Social History Narrative   Not on file   Social Determinants of Health   Financial Resource Strain: Not on file  Food Insecurity: Not on file  Transportation Needs: Not on file  Physical Activity: Not on file  Stress: Not on file  Social Connections: Not on file  Intimate Partner Violence: Not on file     Review of Systems: General: negative for chills, fever, night sweats or weight changes.  Cardiovascular: negative for chest pain, dyspnea on exertion, edema, orthopnea, palpitations, paroxysmal nocturnal dyspnea or shortness of breath Dermatological: negative for rash Respiratory: negative for cough or wheezing Urologic: negative for hematuria Abdominal: negative for nausea, vomiting, diarrhea, bright red blood per rectum, melena, or hematemesis Neurologic: negative for visual changes, syncope, or dizziness All other systems reviewed and are otherwise negative except as noted above.    Blood pressure 120/82, pulse 74, height 6\' 3"  (1.905 m), weight 207 lb 12.8 oz (94.3 kg), SpO2 97 %.  General appearance: alert and no distress Neck: no adenopathy, no carotid bruit, no JVD, supple, symmetrical, trachea midline, and thyroid not  enlarged, symmetric, no tenderness/mass/nodules Lungs: clear to auscultation bilaterally Heart: regular rate and rhythm, S1, S2 normal, no murmur, click, rub or gallop Extremities: extremities normal, atraumatic, no cyanosis or edema Pulses: 2+ and symmetric Skin: Skin color, texture, turgor normal. No rashes or lesions Neurologic: Grossly normal  EKG sinus rhythm at 74 without ST or T wave changes.  I personally reviewed this EKG.  ASSESSMENT AND PLAN:   Peripheral arterial disease ([place]) History of PAD status post bilateral iliac  stenting using "kissing balloon technique by myself 07/11/2007 with excellent result.  He does get some lower extremity claudication and has not had lower extremity Doppler studies in many years which we will repeat.  Hyperlipidemia Recent lipid profile performed 08/03/2021 revealed total cholesterol 191, LDL 134 and HDL 43.  He is not at goal for secondary prevention given his prior iliac stents.  I am going to start him on atorvastatin 40 mg a day and we will recheck a lipid liver profile in 3 months.     Lorretta Harp MD FACP,FACC,FAHA, [facility] 08/11/2021 10:14 AM

## 2021-08-11 NOTE — Assessment & Plan Note (Signed)
History of PAD status post bilateral iliac stenting using "kissing balloon technique by myself 07/11/2007 with excellent result.  He does get some lower extremity claudication and has not had lower extremity Doppler studies in many years which we will repeat.

## 2021-08-11 NOTE — Patient Instructions (Signed)
Medication Instructions:   -Start atorvastatin (lipitor) 40mg  once daily.  *If you need a refill on your cardiac medications before your next appointment, please call your pharmacy*   Lab Work: Your physician recommends that you return for lab work in: 3 months for FASTING lipid/liver profile.  If you have labs (blood work) drawn today and your tests are completely normal, you will receive your results only by: MyChart Message (if you have MyChart) OR A paper copy in the mail If you have any lab test that is abnormal or we need to change your treatment, we will call you to review the results.   Testing/Procedures: Dr. has recommended that you have an Ultrasound of your AORTA/IVC/ILIACS.   To prepare for this test:  No food after 11PM the night before. Water is OK. (Don't drink liquids if you have been instructed not to for ANOTHER test).  Avoid foods that produce bowel gas, for 24 hours prior to exam (see below). No breakfast, no chewing gum, no smoking or carbonated beverages. Patient may take morning medications with water. Come in for test at least 15 minutes early to register.   Your physician has requested that you have a lower extremity arterial duplex. This test is an ultrasound of the arteries in the legs. It looks at arterial blood flow in the legs. Allow one hour for Lower Arterial scans. There are no restrictions or special instructions  Your physician has requested that you have an ankle brachial index (ABI). During this test an ultrasound and blood pressure cuff are used to evaluate the arteries that supply the arms and legs with blood. Allow thirty minutes for this exam. There are no restrictions or special instructions. These procedures will be done at [facility]    Follow-Up: At [facility], you and your health needs are our priority.  As part of our continuing mission to provide you with exceptional heart care, we have created designated Provider  Care Teams.  These Care Teams include your primary Cardiologist (physician) and Advanced Practice Providers (APPs -  Physician Assistants and Nurse Practitioners) who all work together to provide you with the care you need, when you need it.  We recommend signing up for the patient portal called "MyChart".  Sign up information is provided on this After Visit Summary.  MyChart is used to connect with patients for Virtual Visits (Telemedicine).  Patients are able to view lab/test results, encounter notes, upcoming appointments, etc.  Non-urgent messages can be sent to your provider as well.   To learn more about what you can do with MyChart, go to [facility].    Your next appointment:   12 month(s)  The format for your next appointment:   In Person  Provider:   [email or web address], MD

## 2021-08-13 ENCOUNTER — Telehealth: Payer: Self-pay | Admitting: Cardiovascular Disease

## 2021-08-13 MED ORDER — ATORVASTATIN CALCIUM 40 MG PO TABS: 40.0000 mg | ORAL_TABLET | Freq: Every day | ORAL | 3 refills | Status: DC

## 2021-08-13 NOTE — Telephone Encounter (Signed)
? ? ?*  STAT* If patient is at the pharmacy, call can be transferred to refill team. ? ? ?1. Which medications need to be refilled? (please list name of each medication and dose if known) atorvastatin (LIPITOR) 40 MG tablet ? ?2. Which pharmacy/location (including street and city if local pharmacy) is medication to be sent to? [facility], [address], [place], [place] [address], Phone: [phone] ? ?3. Do they need a 30 day or 90 day supply? 90 days ? ?Pt requesting to switch pharmacy because lipitor is cheaper at [facility] ?

## 2021-08-18 ENCOUNTER — Telehealth: Payer: Self-pay | Admitting: Cardiovascular Disease

## 2021-08-18 MED ORDER — ATORVASTATIN CALCIUM 40 MG PO TABS: 40.0000 mg | ORAL_TABLET | Freq: Every day | ORAL | 3 refills | Status: DC

## 2021-08-18 NOTE — Telephone Encounter (Signed)
?*  STAT* If patient is at the pharmacy, call can be transferred to refill team. ? ? ?1. Which medications need to be refilled? (please list name of each medication and dose if known) need a new prescription for Atorvastatin- changing pharmacy ? ?2. Which pharmacy/location (including street and city if local pharmacy) is medication to be sent to? [facility] [place], [place] ? ?3. Do they need a 30 day or 90 day supply? 30 daysand refills ? ?

## 2021-08-19 ENCOUNTER — Other Ambulatory Visit: Payer: Self-pay

## 2021-08-19 MED ORDER — ATORVASTATIN CALCIUM 40 MG PO TABS: 40.0000 mg | ORAL_TABLET | Freq: Every day | ORAL | 3 refills | Status: DC

## 2021-08-19 NOTE — Telephone Encounter (Signed)
Patient wants this prescription to go to the [facility] on [address]. Patient would like a call once this is done.  ?

## 2021-08-19 NOTE — Telephone Encounter (Signed)
Refill sent to [facility] on [address]  ?

## 2021-09-10 ENCOUNTER — Ambulatory Visit (HOSPITAL_BASED_OUTPATIENT_CLINIC_OR_DEPARTMENT_OTHER)
Admission: RE | Admit: 2021-09-10 | Discharge: 2021-09-10 | Disposition: A | Discharge status: Home / Self Care | Payer: PRIVATE HEALTH INSURANCE | Source: Ambulatory Visit | Attending: Cardiovascular Disease | Admitting: Cardiovascular Disease

## 2021-09-10 ENCOUNTER — Ambulatory Visit (HOSPITAL_COMMUNITY)
Admission: RE | Admit: 2021-09-10 | Discharge: 2021-09-10 | Disposition: A | Discharge status: Home / Self Care | Payer: PRIVATE HEALTH INSURANCE | Source: Ambulatory Visit | Attending: Cardiology | Admitting: Cardiology

## 2021-09-10 DIAGNOSIS — Z95828 Presence of other vascular implants and grafts: Secondary | ICD-10-CM | POA: Diagnosis not present

## 2021-09-10 DIAGNOSIS — I739 Peripheral vascular disease, unspecified: Secondary | ICD-10-CM | POA: Insufficient documentation

## 2021-10-07 ENCOUNTER — Encounter: Payer: Self-pay | Admitting: Cardiovascular Disease

## 2021-10-07 ENCOUNTER — Ambulatory Visit (INDEPENDENT_AMBULATORY_CARE_PROVIDER_SITE_OTHER): Payer: PRIVATE HEALTH INSURANCE | Admitting: Cardiovascular Disease

## 2021-10-07 ENCOUNTER — Other Ambulatory Visit: Payer: Self-pay

## 2021-10-07 DIAGNOSIS — I739 Peripheral vascular disease, unspecified: Secondary | ICD-10-CM | POA: Diagnosis not present

## 2021-10-07 DIAGNOSIS — E782 Mixed hyperlipidemia: Secondary | ICD-10-CM | POA: Diagnosis not present

## 2021-10-07 MED ORDER — SODIUM CHLORIDE 0.9% FLUSH: 3.0000 mL | Freq: Two times a day (BID) | INTRAVENOUS | Status: DC

## 2021-10-07 NOTE — Patient Instructions (Signed)
Medication Instructions:  ?Your physician recommends that you continue on your current medications as directed. Please refer to the Current Medication list given to you today. ? ?*If you need a refill on your cardiac medications before your next appointment, please call your pharmacy* ? ? ?Lab Work: ?Your physician recommends that you have labs drawn today: BMET & CBC ? ?If you have labs (blood work) drawn today and your tests are completely normal, you will receive your results only by: ?MyChart Message (if you have MyChart) OR ?A paper copy in the mail ?If you have any lab test that is abnormal or we need to change your treatment, we will call you to review the results. ? ? ?Testing/Procedures: ?Dr. [name] has recommended that you have an Ultrasound of your AORTA/IVC/ILIACS.  ? ?To prepare for this test: ? ?No food after 11PM the night before. Water is OK. (Don't drink liquids if you have been instructed not to for ANOTHER test).  ?Avoid foods that produce bowel gas, for 24 hours prior to exam (see below). ?No breakfast, no chewing gum, no smoking or carbonated beverages. ?Patient may take morning medications with water. ?Come in for test at least 15 minutes early to register. ? ?Your physician has requested that you have an ankle brachial index (ABI). During this test an ultrasound and blood pressure cuff are used to evaluate the arteries that supply the arms and legs with blood. Allow thirty minutes for this exam. There are no restrictions or special instructions. ?To be done 1-2 weeks after PV procedure (5/4). These procedures will be done at [address] [facility] ? ? ?Follow-Up: ?At [facility], you and your health needs are our priority.  As part of our continuing mission to provide you with exceptional heart care, we have created designated Provider Care Teams.  These Care Teams include your primary Cardiologist (physician) and Advanced Practice Providers (APPs -  Physician Assistants and Nurse  Practitioners) who all work together to provide you with the care you need, when you need it. ? ?We recommend signing up for the patient portal called "MyChart".  Sign up information is provided on this After Visit Summary.  MyChart is used to connect with patients for Virtual Visits (Telemedicine).  Patients are able to view lab/test results, encounter notes, upcoming appointments, etc.  Non-urgent messages can be sent to your provider as well.   ?To learn more about what you can do with MyChart, go to [email or web address].   ? ?Your next appointment:   ?2-3 week(s) after PV procedure (5/4) ? ?The format for your next appointment:   ?In Person ? ?Provider:   ?Nanetta Batty, MD ? ? ?Other Instructions ? ?[facility] ?[facility] ?[address] ?[place] [address] ?Dept: [phone] ?Loc: [phone] ? ?Adam Meyer  10/07/2021 ? ?You are scheduled for a Peripheral Angiogram on Thursday, May 4 with Dr. Nanetta Batty. ? ?1. Please arrive at the Main Entrance A at [facility]: [address] [place], [place] [address] at 9:30 AM (This time is two hours before your procedure to ensure your preparation). Free valet parking service is available.  ? ?Special note: Every effort is made to have your procedure done on time. Please understand that emergencies sometimes delay scheduled procedures. ? ?2. Diet: Do not eat solid foods after midnight.  You may have clear liquids until 5 AM upon the day of the procedure. ? ?3. Labs: You will need to  have blood drawn today. ? ? ?4. Medication instructions in preparation for your procedure: ? ? ?On the morning of your procedure, take Aspirin and any morning medicines NOT listed above.  You may use sips of water. ? ?5. Plan to go home the same day, you will only stay overnight if medically necessary. ?6. You MUST have a responsible adult to drive you home. ?7. An adult MUST be with you the  first 24 hours after you arrive home. ?8. Bring a current list of your medications, and the last time and date medication taken. ?9. Bring ID and current insurance cards. ?10.Please wear clothes that are easy to get on and off and wear slip-on shoes. ? ?Thank you for allowing [place] to care for you! ?  -- [facility] ?

## 2021-10-07 NOTE — Progress Notes (Signed)
? ? ? ?10/07/2021 ?Lenore Cordia   ?01-03-1971  ?[id number] ? ?Primary Physician Pcp, No ?Primary Cardiologist: Lorretta Harp MD Lupe Carney, [place] ? ?HPI:  Adam Meyer is a 51 y.o.  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  He was referred to me by Dr. Junius Roads, his PCP, for peripheral vascular valuation because of left lower extremity claudication.  I last saw him in the office 08/11/2021.  He basically has no cardiac risk factors.  He does does not smoke.  There is no family history Gwenlyn Found is never had a heart attack or stroke.  I did do cardiac catheterization on him 07/11/2007 revealing normal coronary arteries and LV function.  He did have claudication and bilateral iliac disease.  I performed PTA and stenting of his iliac bifurcation using "kissing stent technique 07/11/2007 with excellent result.  Over the last year he is noticed left lower extremity claudication.  He is fairly active.  ? ?Since I saw him 2 months ago he did have lower extremity arterial Doppler studies performed 09/10/2021 that showed an occluded left common iliac artery stent with moderate right common iliac artery stenosis.  He wishes to proceed with angiography and reintervention.  He denies chest pain or shortness of breath. ? ? ?Current Meds  ?Medication Sig  ? aspirin 81 MG chewable tablet Chew 81 mg by mouth daily.  ? atorvastatin (LIPITOR) 40 MG tablet Take 1 tablet (40 mg total) by mouth daily.  ? ibuprofen (ADVIL) 200 MG tablet Take 200 mg by mouth every 6 (six) hours as needed for mild pain.  ? Multiple Vitamin (MULTIVITAMIN WITH MINERALS) TABS tablet Take 1 tablet by mouth daily.  ?  ? ?No Known Allergies ? ?Social History  ? ?Socioeconomic History  ? Marital status: Divorced  ?  Spouse name: Not on file  ? Number of children: Not on file  ? Years of education: Not on file  ? Highest education level: Not on file  ?Occupational History  ? Not on file  ?Tobacco Use   ? Smoking status: Never  ? Smokeless tobacco: Never  ?Substance and Sexual Activity  ? Alcohol use: Not Currently  ? Drug use: Yes  ?  Types: Marijuana  ?  Comment: WEEKLY  ? Sexual activity: Yes  ?  Partners: Female  ?Other Topics Concern  ? Not on file  ?Social History Narrative  ? Not on file  ? ?Social Determinants of Health  ? ?Financial Resource Strain: Not on file  ?Food Insecurity: Not on file  ?Transportation Needs: Not on file  ?Physical Activity: Not on file  ?Stress: Not on file  ?Social Connections: Not on file  ?Intimate Partner Violence: Not on file  ?  ? ?Review of Systems: ?General: negative for chills, fever, night sweats or weight changes.  ?Cardiovascular: negative for chest pain, dyspnea on exertion, edema, orthopnea, palpitations, paroxysmal nocturnal dyspnea or shortness of breath ?Dermatological: negative for rash ?Respiratory: negative for cough or wheezing ?Urologic: negative for hematuria ?Abdominal: negative for nausea, vomiting, diarrhea, bright red blood per rectum, melena, or hematemesis ?Neurologic: negative for visual changes, syncope, or dizziness ?All other systems reviewed and are otherwise negative except as noted above. ? ? ? ?Blood pressure 110/72, pulse 80, height 6\' 3"  (1.905 m), weight 204 lb (92.5 kg).  ?General appearance: alert and no distress ?Neck: no adenopathy, no carotid bruit, no JVD, supple, symmetrical, trachea midline, and thyroid not  enlarged, symmetric, no tenderness/mass/nodules ?Lungs: clear to auscultation bilaterally ?Heart: regular rate and rhythm, S1, S2 normal, no murmur, click, rub or gallop ?Extremities: extremities normal, atraumatic, no cyanosis or edema ?Pulses: Diminished pedal pulses ?Skin: Skin color, texture, turgor normal. No rashes or lesions ?Neurologic: Grossly normal ? ?EKG not performed today ? ?ASSESSMENT AND PLAN:  ? ?Peripheral arterial disease ([place]) ?Mr. Babiak returns today for follow-up of his PAD.  I performed bilateral iliac  stenting using "kissing stent technique 07/11/2007 with excellent result.  When I saw him a year ago he was complaining of some left lower extremity claudication.  Dopplers performed 09/10/2021 revealed a right ABI 1.09 the left of 0.75 with an occluded left common iliac artery stent and moderate right iliac stenosis.  He wishes to proceed with angiography and reintervention.  We will use VBX covered stents. ? ?Hyperlipidemia ?Marland KitchenHistory of hyperlipidemia on statin therapy with lipid profile performed 09/04/2021 revealing a total cholesterol of 191, LDL 134 and HDL 43.  He is not at goal for secondary prevention.  His lipid profile performed 10/10/2020 revealed total cholesterol 231 with an LDL of 169 he says that he eats "a heart healthy diet".  I am referring him to Dr. Debara Pickett for consideration of a PCSK9. ? ? ? ? ?Lorretta Harp MD [facility], FSCAI ?10/07/2021 ?9:56 AM ?

## 2021-10-07 NOTE — Assessment & Plan Note (Signed)
Adam Meyer returns today for follow-up of his PAD.  I performed bilateral iliac stenting using "kissing stent technique 07/11/2007 with excellent result.  When I saw him a year ago he was complaining of some left lower extremity claudication.  Dopplers performed 09/10/2021 revealed a right ABI 1.09 the left of 0.75 with an occluded left common iliac artery stent and moderate right iliac stenosis.  He wishes to proceed with angiography and reintervention.  We will use VBX covered stents. ?

## 2021-10-07 NOTE — H&P (View-Only) (Signed)
? ? ? ?10/07/2021 ?Adam Meyer   ?04/04/1971  ?[id number] ? ?Primary Physician Pcp, No ?Primary Cardiologist: Adam Maye Meyer Alvino Lechuga MD FACP, FACC, FAHA, FSCAI ? ?HPI:  Adam Meyer is a 50 y.o.  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  He was referred to me by Dr. Hilts, his PCP, for peripheral vascular valuation because of left lower extremity claudication.  I last saw him in the office 08/11/2021.  He basically has no cardiac risk factors.  He does does not smoke.  There is no family history Adam Meyer is never had a heart attack or stroke.  I did do cardiac catheterization on him 07/11/2007 revealing normal coronary arteries and LV function.  He did have claudication and bilateral iliac disease.  I performed PTA and stenting of his iliac bifurcation using "kissing stent technique 07/11/2007 with excellent result.  Over the last year he is noticed left lower extremity claudication.  He is fairly active.  ? ?Since I saw him 2 months ago he did have lower extremity arterial Doppler studies performed 09/10/2021 that showed an occluded left common iliac artery stent with moderate right common iliac artery stenosis.  He wishes to proceed with angiography and reintervention.  He denies chest pain or shortness of breath. ? ? ?Current Meds  ?Medication Sig  ? aspirin 81 MG chewable tablet Chew 81 mg by mouth daily.  ? atorvastatin (LIPITOR) 40 MG tablet Take 1 tablet (40 mg total) by mouth daily.  ? ibuprofen (ADVIL) 200 MG tablet Take 200 mg by mouth every 6 (six) hours as needed for mild pain.  ? Multiple Vitamin (MULTIVITAMIN WITH MINERALS) TABS tablet Take 1 tablet by mouth daily.  ?  ? ?No Known Allergies ? ?Social History  ? ?Socioeconomic History  ? Marital status: Divorced  ?  Spouse name: Not on file  ? Number of children: Not on file  ? Years of education: Not on file  ? Highest education level: Not on file  ?Occupational History  ? Not on file  ?Tobacco Use   ? Smoking status: Never  ? Smokeless tobacco: Never  ?Substance and Sexual Activity  ? Alcohol use: Not Currently  ? Drug use: Yes  ?  Types: Marijuana  ?  Comment: WEEKLY  ? Sexual activity: Yes  ?  Partners: Female  ?Other Topics Concern  ? Not on file  ?Social History Narrative  ? Not on file  ? ?Social Determinants of Health  ? ?Financial Resource Strain: Not on file  ?Food Insecurity: Not on file  ?Transportation Needs: Not on file  ?Physical Activity: Not on file  ?Stress: Not on file  ?Social Connections: Not on file  ?Intimate Partner Violence: Not on file  ?  ? ?Review of Systems: ?General: negative for chills, fever, night sweats or weight changes.  ?Cardiovascular: negative for chest pain, dyspnea on exertion, edema, orthopnea, palpitations, paroxysmal nocturnal dyspnea or shortness of breath ?Dermatological: negative for rash ?Respiratory: negative for cough or wheezing ?Urologic: negative for hematuria ?Abdominal: negative for nausea, vomiting, diarrhea, bright red blood per rectum, melena, or hematemesis ?Neurologic: negative for visual changes, syncope, or dizziness ?All other systems reviewed and are otherwise negative except as noted above. ? ? ? ?Blood pressure 110/72, pulse 80, height 6' 3" (1.905 m), weight 204 lb (92.5 kg).  ?General appearance: alert and no distress ?Neck: no adenopathy, no carotid bruit, no JVD, supple, symmetrical, trachea midline, and thyroid not   enlarged, symmetric, no tenderness/mass/nodules ?Lungs: clear to auscultation bilaterally ?Heart: regular rate and rhythm, S1, S2 normal, no murmur, click, rub or gallop ?Extremities: extremities normal, atraumatic, no cyanosis or edema ?Pulses: Diminished pedal pulses ?Skin: Skin color, texture, turgor normal. No rashes or lesions ?Neurologic: Grossly normal ? ?EKG not performed today ? ?ASSESSMENT AND PLAN:  ? ?Peripheral arterial disease ([facility]) ?Mr. Adam Meyer returns today for follow-up of his PAD.  I performed bilateral iliac  stenting using "kissing stent technique 07/11/2007 with excellent result.  When I saw him a year ago he was complaining of some left lower extremity claudication.  Dopplers performed 09/10/2021 revealed a right ABI 1.09 the left of 0.75 with an occluded left common iliac artery stent and moderate right iliac stenosis.  He wishes to proceed with angiography and reintervention.  We will use VBX covered stents. ? ?Hyperlipidemia ?.History of hyperlipidemia on statin therapy with lipid profile performed 09/04/2021 revealing a total cholesterol of 191, LDL 134 and HDL 43.  He is not at goal for secondary prevention.  His lipid profile performed 10/10/2020 revealed total cholesterol 231 with an LDL of 169 he says that he eats "a heart healthy diet".  I am referring him to Dr. Hilty for consideration of a PCSK9. ? ? ? ? ?Adam Meyer. Adam Jump MD FACP,FACC,FAHA, FSCAI ?10/07/2021 ?9:56 AM ?

## 2021-10-07 NOTE — Assessment & Plan Note (Addendum)
.  History of hyperlipidemia on statin therapy with lipid profile performed 09/04/2021 revealing a total cholesterol of 191, LDL 134 and HDL 43.  He is not at goal for secondary prevention.  His lipid profile performed 10/10/2020 revealed total cholesterol 231 with an LDL of 169 he says that he eats "a heart healthy diet".  I am referring him to Dr. Rennis Golden for consideration of a PCSK9. ?

## 2021-10-08 LAB — BASIC METABOLIC PANEL
BUN/Creatinine Ratio: 18 (ref 9–20)
BUN: 16 mg/dL (ref 6–24)
CO2: 24 mmol/L (ref 20–29)
Calcium: 9.5 mg/dL (ref 8.7–10.2)
Chloride: 102 mmol/L (ref 96–106)
Creatinine, Ser: 0.9 mg/dL (ref 0.76–1.27)
Glucose: 100 mg/dL — ABNORMAL HIGH (ref 70–99)
Potassium: 4.7 mmol/L (ref 3.5–5.2)
Sodium: 139 mmol/L (ref 134–144)
eGFR: 103 mL/min/{1.73_m2} (ref >59)

## 2021-10-08 LAB — CBC
Hematocrit: 43.3 % (ref 37.5–51.0)
Hemoglobin: 14.6 g/dL (ref 13.0–17.7)
MCH: 30.7 pg (ref 26.6–33.0)
MCHC: 33.7 g/dL (ref 31.5–35.7)
MCV: 91 fL (ref 79–97)
Platelets: 215 10*3/uL (ref 150–450)
RBC: 4.76 x10E6/uL (ref 4.14–5.80)
RDW: 13.6 % (ref 11.6–15.4)
WBC: 7.3 10*3/uL (ref 3.4–10.8)

## 2021-10-14 ENCOUNTER — Telehealth: Payer: Self-pay | Admitting: *Deleted

## 2021-10-14 NOTE — Telephone Encounter (Signed)
Abdominal aortogram scheduled at [facility] for: Thursday Oct 15, 2021 11:30 AM ?Arrival time and place: [facility] Main Entrance A at: 9:30 AM ? ? ?No solid food after midnight prior to cath, clear liquids until 5 AM day of procedure. ? ?Medication instructions: ?-Usual morning medications can be taken with sips of water including aspirin 81 mg. ? ?Confirmed patient has responsible adult to drive home post procedure and be with patient first 24 hours after arriving home. ? ?Patient reports no new symptoms concerning for COVID-19/no exposure to COVID-19 in the past 10 days. ? ?Reviewed procedure instructions with patient. ?

## 2021-10-15 ENCOUNTER — Ambulatory Visit (HOSPITAL_COMMUNITY)
Admission: RE | Admit: 2021-10-15 | Discharge: 2021-10-16 | Disposition: A | Discharge status: Home / Self Care | Payer: PRIVATE HEALTH INSURANCE | Attending: Cardiovascular Disease | Admitting: Cardiovascular Disease

## 2021-10-15 ENCOUNTER — Other Ambulatory Visit: Payer: Self-pay

## 2021-10-15 ENCOUNTER — Encounter (HOSPITAL_COMMUNITY): Payer: Self-pay | Admitting: Cardiovascular Disease

## 2021-10-15 ENCOUNTER — Encounter (HOSPITAL_COMMUNITY)
Admission: RE | Disposition: A | Discharge status: Home / Self Care | Payer: Self-pay | Source: Home / Self Care | Attending: Cardiovascular Disease

## 2021-10-15 DIAGNOSIS — Y832 Surgical operation with anastomosis, bypass or graft as the cause of abnormal reaction of the patient, or of later complication, without mention of misadventure at the time of the procedure: Secondary | ICD-10-CM | POA: Diagnosis not present

## 2021-10-15 DIAGNOSIS — E785 Hyperlipidemia, unspecified: Secondary | ICD-10-CM | POA: Insufficient documentation

## 2021-10-15 DIAGNOSIS — T82856A Stenosis of peripheral vascular stent, initial encounter: Secondary | ICD-10-CM | POA: Diagnosis not present

## 2021-10-15 DIAGNOSIS — I708 Atherosclerosis of other arteries: Secondary | ICD-10-CM | POA: Diagnosis not present

## 2021-10-15 DIAGNOSIS — I70213 Atherosclerosis of native arteries of extremities with intermittent claudication, bilateral legs: Secondary | ICD-10-CM | POA: Diagnosis not present

## 2021-10-15 DIAGNOSIS — I739 Peripheral vascular disease, unspecified: Secondary | ICD-10-CM | POA: Diagnosis present

## 2021-10-15 HISTORY — PX: ABDOMINAL AORTOGRAM W/LOWER EXTREMITY: CATH118223

## 2021-10-15 HISTORY — PX: PERIPHERAL VASCULAR INTERVENTION: CATH118257

## 2021-10-15 LAB — POCT ACTIVATED CLOTTING TIME
Activated Clotting Time: 389 seconds
Activated Clotting Time: 293 seconds
Activated Clotting Time: 233 seconds
Activated Clotting Time: 203 seconds
Activated Clotting Time: 203 seconds

## 2021-10-15 SURGERY — ABDOMINAL AORTOGRAM W/LOWER EXTREMITY
Anesthesia: LOCAL | Laterality: Bilateral

## 2021-10-15 MED ORDER — LIDOCAINE HCL (PF) 1 % IJ SOLN
INTRAMUSCULAR | Status: AC
  Filled 2021-10-15: qty 30

## 2021-10-15 MED ORDER — HEPARIN (PORCINE) IN NACL 1000-0.9 UT/500ML-% IV SOLN
INTRAVENOUS | Status: DC | PRN
  Administered 2021-10-15 (×2): 500 mL

## 2021-10-15 MED ORDER — LIDOCAINE 5 % EX PTCH
1.0000 | MEDICATED_PATCH | CUTANEOUS | Status: DC
  Administered 2021-10-15: 1 via TRANSDERMAL
  Filled 2021-10-15 (×2): qty 1

## 2021-10-15 MED ORDER — SODIUM CHLORIDE 0.9 % IV SOLN: 250.0000 mL | INTRAVENOUS | Status: DC | PRN

## 2021-10-15 MED ORDER — SODIUM CHLORIDE 0.9 % IV SOLN: INTRAVENOUS | Status: AC

## 2021-10-15 MED ORDER — ASPIRIN 81 MG PO CHEW
81.0000 mg | CHEWABLE_TABLET | Freq: Every day | ORAL | Status: DC
Start: 2021-10-15 — End: 2021-10-15

## 2021-10-15 MED ORDER — NITROGLYCERIN 1 MG/10 ML FOR IR/CATH LAB
INTRA_ARTERIAL | Status: DC | PRN
  Administered 2021-10-15: 200 ug via INTRA_ARTERIAL

## 2021-10-15 MED ORDER — SODIUM CHLORIDE 0.9 % WEIGHT BASED INFUSION: 1.0000 mL/kg/h | INTRAVENOUS | Status: DC

## 2021-10-15 MED ORDER — SODIUM CHLORIDE 0.9% FLUSH: 3.0000 mL | INTRAVENOUS | Status: DC | PRN

## 2021-10-15 MED ORDER — FENTANYL CITRATE (PF) 100 MCG/2ML IJ SOLN
INTRAMUSCULAR | Status: DC | PRN
  Administered 2021-10-15: 25 ug via INTRAVENOUS

## 2021-10-15 MED ORDER — ATROPINE SULFATE 1 MG/10ML IJ SOSY
PREFILLED_SYRINGE | INTRAMUSCULAR | Status: AC
  Filled 2021-10-15: qty 10

## 2021-10-15 MED ORDER — SODIUM CHLORIDE 0.9% FLUSH
3.0000 mL | Freq: Two times a day (BID) | INTRAVENOUS | Status: DC
  Administered 2021-10-16: 3 mL via INTRAVENOUS

## 2021-10-15 MED ORDER — ATORVASTATIN CALCIUM 40 MG PO TABS
40.0000 mg | ORAL_TABLET | Freq: Every day | ORAL | Status: DC
  Administered 2021-10-16: 40 mg via ORAL
  Filled 2021-10-15: qty 1

## 2021-10-15 MED ORDER — ACETAMINOPHEN 325 MG PO TABS
650.0000 mg | ORAL_TABLET | ORAL | Status: DC | PRN
  Filled 2021-10-15: qty 2

## 2021-10-15 MED ORDER — MIDAZOLAM HCL 2 MG/2ML IJ SOLN
INTRAMUSCULAR | Status: DC | PRN
  Administered 2021-10-15: 1 mg via INTRAVENOUS

## 2021-10-15 MED ORDER — HEPARIN (PORCINE) IN NACL 1000-0.9 UT/500ML-% IV SOLN
INTRAVENOUS | Status: AC
  Filled 2021-10-15: qty 1000

## 2021-10-15 MED ORDER — HYDRALAZINE HCL 20 MG/ML IJ SOLN: 5.0000 mg | INTRAMUSCULAR | Status: DC | PRN

## 2021-10-15 MED ORDER — ASPIRIN 81 MG PO CHEW: 81.0000 mg | CHEWABLE_TABLET | ORAL | Status: DC

## 2021-10-15 MED ORDER — CLOPIDOGREL BISULFATE 300 MG PO TABS
ORAL_TABLET | ORAL | Status: DC | PRN
  Administered 2021-10-15: 600 mg via ORAL

## 2021-10-15 MED ORDER — ONDANSETRON HCL 4 MG/2ML IJ SOLN
4.0000 mg | Freq: Four times a day (QID) | INTRAMUSCULAR | Status: DC | PRN
Start: 2021-10-15 — End: 2021-10-16

## 2021-10-15 MED ORDER — LIDOCAINE HCL (PF) 1 % IJ SOLN
INTRAMUSCULAR | Status: DC | PRN
  Administered 2021-10-15 (×2): 20 mL

## 2021-10-15 MED ORDER — ASPIRIN EC 81 MG PO TBEC
81.0000 mg | DELAYED_RELEASE_TABLET | Freq: Every day | ORAL | Status: DC
  Administered 2021-10-16: 81 mg via ORAL
  Filled 2021-10-15: qty 1

## 2021-10-15 MED ORDER — CLOPIDOGREL BISULFATE 75 MG PO TABS
75.0000 mg | ORAL_TABLET | Freq: Every day | ORAL | Status: DC
  Administered 2021-10-16: 75 mg via ORAL
  Filled 2021-10-15: qty 1

## 2021-10-15 MED ORDER — NITROGLYCERIN 1 MG/10 ML FOR IR/CATH LAB
INTRA_ARTERIAL | Status: AC
  Filled 2021-10-15: qty 10

## 2021-10-15 MED ORDER — FENTANYL CITRATE (PF) 100 MCG/2ML IJ SOLN
INTRAMUSCULAR | Status: AC
  Filled 2021-10-15: qty 2

## 2021-10-15 MED ORDER — SODIUM CHLORIDE 0.9 % WEIGHT BASED INFUSION
3.0000 mL/kg/h | INTRAVENOUS | Status: DC
  Administered 2021-10-15: 3 mL/kg/h via INTRAVENOUS

## 2021-10-15 MED ORDER — HEPARIN SODIUM (PORCINE) 1000 UNIT/ML IJ SOLN
INTRAMUSCULAR | Status: DC | PRN
Start: 2021-10-15 — End: 2021-10-15
  Administered 2021-10-15: 11000 [IU] via INTRAVENOUS

## 2021-10-15 MED ORDER — LABETALOL HCL 5 MG/ML IV SOLN: 10.0000 mg | INTRAVENOUS | Status: DC | PRN

## 2021-10-15 MED ORDER — CLOPIDOGREL BISULFATE 300 MG PO TABS
ORAL_TABLET | ORAL | Status: AC
  Filled 2021-10-15: qty 2

## 2021-10-15 MED ORDER — MIDAZOLAM HCL 2 MG/2ML IJ SOLN
INTRAMUSCULAR | Status: AC
  Filled 2021-10-15: qty 2

## 2021-10-15 MED ORDER — IODIXANOL 320 MG/ML IV SOLN
INTRAVENOUS | Status: DC | PRN
Start: 2021-10-15 — End: 2021-10-15
  Administered 2021-10-15: 160 mL via INTRA_ARTERIAL

## 2021-10-15 MED ORDER — HEPARIN SODIUM (PORCINE) 1000 UNIT/ML IJ SOLN
INTRAMUSCULAR | Status: AC
  Filled 2021-10-15: qty 20

## 2021-10-15 MED ORDER — MORPHINE SULFATE (PF) 2 MG/ML IV SOLN: 2.0000 mg | INTRAVENOUS | Status: DC | PRN

## 2021-10-15 SURGICAL SUPPLY — 19 items
BALLN MUSTANG 4.0X40 75 (BALLOONS) ×2
BALLOON MUSTANG 4.0X40 75 (BALLOONS) IMPLANT
CATH ANGIO 5F PIGTAIL 65CM (CATHETERS) ×1 IMPLANT
CATH NAVICROSS ST 65CM (CATHETERS) IMPLANT
CATH STRAIGHT 5FR 65CM (CATHETERS) ×1 IMPLANT
CATHETER NAVICROSS ST 65CM (CATHETERS) ×2
GLIDEWIRE ANGLED SS 035X260CM (WIRE) ×1 IMPLANT
KIT ENCORE 26 ADVANTAGE (KITS) ×2 IMPLANT
KIT PV (KITS) ×2 IMPLANT
SHEATH BRITE TIP 7FR 35CM (SHEATH) ×2 IMPLANT
SHEATH PINNACLE 5F 10CM (SHEATH) ×2 IMPLANT
SHEATH PINNACLE 7F 10CM (SHEATH) ×2 IMPLANT
SHEATH PROBE COVER 6X72 (BAG) ×1 IMPLANT
STENT VIABAHN 7X29X80 VBX (Permanent Stent) ×1 IMPLANT
STENT VIABAHN 7X39X80 VBX (Permanent Stent) ×2 IMPLANT
SYR MEDRAD MARK 7 150ML (SYRINGE) ×2 IMPLANT
TRANSDUCER W/STOPCOCK (MISCELLANEOUS) ×2 IMPLANT
TRAY PV CATH (CUSTOM PROCEDURE TRAY) ×2 IMPLANT
WIRE HITORQ VERSACORE ST 145CM (WIRE) ×2 IMPLANT

## 2021-10-15 NOTE — Interval H&P Note (Signed)
History and Physical Interval Note: ? ?10/15/2021 ?2:02 PM ? ?Adam Meyer  has presented today for surgery, with the diagnosis of pad.  The various methods of treatment have been discussed with the patient and family. After consideration of risks, benefits and other options for treatment, the patient has consented to  Procedure(s): ?ABDOMINAL AORTOGRAM W/LOWER EXTREMITY (N/A) as a surgical intervention.  The patient's history has been reviewed, patient examined, no change in status, stable for surgery.  I have reviewed the patient's chart and labs.  Questions were answered to the patient's satisfaction.   ? ? ?Quay Burow ? ? ?

## 2021-10-15 NOTE — Progress Notes (Addendum)
SITE AREA: right and left groin/femoral ? ?SITE PRIOR TO REMOVAL:  LEVEL 0 ? ?PRESSURE APPLIED FOR: approximately 20 minutes to each groin ? ?MANUAL: yes ? ?PATIENT STATUS DURING PULL: emotional, vss ? ?POST PULL SITE:  LEVEL 0 ? ?POST PULL INSTRUCTIONS GIVEN: yes ? ?POST PULL PULSES PRESENT: bilateral pedal pulses dopplerable ? ?DRESSING APPLIED: gauze with tegaderm to bilateral groins ? ?BEDREST BEGINS @ 1953 ? ?COMMENTS: left sheath removed by Raynelle Fanning, RN, right sheath removed by Kennyth Arnold, RN, Millicent, supervisor at bedside giving emotional support to pt ?

## 2021-10-16 ENCOUNTER — Encounter (HOSPITAL_COMMUNITY): Payer: Self-pay | Admitting: Cardiovascular Disease

## 2021-10-16 DIAGNOSIS — I739 Peripheral vascular disease, unspecified: Secondary | ICD-10-CM | POA: Diagnosis not present

## 2021-10-16 DIAGNOSIS — T82856A Stenosis of peripheral vascular stent, initial encounter: Secondary | ICD-10-CM | POA: Diagnosis not present

## 2021-10-16 LAB — COMPREHENSIVE METABOLIC PANEL
ALT: 22 U/L (ref 0–44)
AST: 25 U/L (ref 15–41)
Albumin: 3.4 g/dL — ABNORMAL LOW (ref 3.5–5.0)
Alkaline Phosphatase: 79 U/L (ref 38–126)
Anion gap: 6 (ref 5–15)
BUN: 12 mg/dL (ref 6–20)
CO2: 24 mmol/L (ref 22–32)
Calcium: 8.8 mg/dL — ABNORMAL LOW (ref 8.9–10.3)
Chloride: 107 mmol/L (ref 98–111)
Creatinine, Ser: 0.9 mg/dL (ref 0.61–1.24)
GFR, Estimated: >60 mL/min (ref >60)
Glucose, Bld: 112 mg/dL — ABNORMAL HIGH (ref 70–99)
Potassium: 3.6 mmol/L (ref 3.5–5.1)
Sodium: 137 mmol/L (ref 135–145)
Total Bilirubin: 0.8 mg/dL (ref 0.3–1.2)
Total Protein: 6.2 g/dL — ABNORMAL LOW (ref 6.5–8.1)

## 2021-10-16 LAB — CBC
HCT: 39 % (ref 39.0–52.0)
Hemoglobin: 13.3 g/dL (ref 13.0–17.0)
MCH: 30.7 pg (ref 26.0–34.0)
MCHC: 34.1 g/dL (ref 30.0–36.0)
MCV: 90.1 fL (ref 80.0–100.0)
Platelets: 191 10*3/uL (ref 150–400)
RBC: 4.33 MIL/uL (ref 4.22–5.81)
RDW: 13.3 % (ref 11.5–15.5)
WBC: 9.2 10*3/uL (ref 4.0–10.5)
nRBC: 0 % (ref 0.0–0.2)

## 2021-10-16 LAB — LIPID PANEL
Cholesterol: 108 mg/dL (ref 0–200)
HDL: 36 mg/dL — ABNORMAL LOW (ref >40)
LDL Cholesterol: 62 mg/dL (ref 0–99)
Total CHOL/HDL Ratio: 3 RATIO
Triglycerides: 48 mg/dL (ref <150)
VLDL: 10 mg/dL (ref 0–40)

## 2021-10-16 MED ORDER — CLOPIDOGREL BISULFATE 75 MG PO TABS
75.0000 mg | ORAL_TABLET | Freq: Every day | ORAL | 3 refills | Status: DC
Start: 2021-10-17 — End: 2022-01-20

## 2021-10-16 NOTE — Discharge Summary (Addendum)
?Discharge Summary  ?  ?Patient ID: Adam Meyer ?MRN: [id number]; DOB: 05-06-1971 ? ?Admit date: 10/15/2021 ?Discharge date: 10/16/2021 ? ?PCP:  Pcp, No ?  ?[facility] Providers ?Cardiologist:  Adam Burow, MD      ? ? ?Discharge Diagnoses  ?  ?Principal Problem: ?  Claudication in peripheral vascular disease ([place]) ? ? ?Diagnostic Studies/Procedures  ?  ?LE angiography 10/15/2021 ?Angiographic Data:  ?  ?1: Abdominal aorta-the distal abdominal aorta was moderately atherosclerotic ?2: Left lower extremity-the left common iliac artery stent was totally occluded throughout its entirety. ?3: Right lower extremity-the right common iliac artery stent had a 50% "in-stent restenosis of the 30 mm pullback gradient using endhole catheter and 200 mcg of intra-arterial nitroglycerin. ?  ?  ?Procedure Description: I changed the 5 French sheath in the right common femoral artery for a 7 French Brite tip sheath.  I then accessed the left common femoral artery under ultrasound guidance and placed a 7 French Brite tip sheath.  I gave 11,000's of heparin with a ending ACT of 293.  Omnipaque dye was used for the entirety of the intervention.  Retroaortic pressures monitored to the case. ? ?I was able to cross the CTO with an 035 straight Navicross endhole catheter along with a 035 stiff angled Glidewire.  I then exchanged this for an 035 versa core wire and predilated within the left common iliac artery CTO with a 4 mm x 4 cm balloon. ? ?I then placed a 7 mm x 39 mm long VBX stents into the right and left common iliac artery stents and deployed them simultaneously using "kissing stent technique.  There was a filling defect at the distal edge of the left common iliac artery stent which I covered with an additional 7 mm x 29 mm long VBX stent.  The final angiographic result result was reduction of a 50% right and a total left common iliac artery stenosis to 0% residual.  Patient tolerated procedure well.  The Brite tip sheath were  then exchanged over 0.35 versa core wires for short 7 French sheaths which were then sewn securely in place.  The patient received 600 mg of p.o. Plavix. ?  ?Final Impression: Successful PTA and covered stenting of bilateral iliac arteries for a left common iliac artery "CTO" using VBX covered stents and "kissing stent technique.  The short 7 French sheath will be removed once the ACT falls below 170 and pressure will be held.  The patient will be hydrated overnight, discharged home tomorrow morning on aspirin and clopidogrel.  We will get aortoiliac Dopplers next week in our [facility] line office and I will see him back a week or 2 thereafter.  He left the lab in stable condition. ?  ?_____________ ?  ?History of Present Illness   ?  ?Adam Meyer is a 51 y.o. male with peripheral vascular valuation because of left lower extremity claudication.  I last saw him in the office 08/11/2021.  He basically has no cardiac risk factors.  He does does not smoke.  There is no family history Adam Meyer is never had a heart attack or stroke.  I did do cardiac catheterization on him 07/11/2007 revealing normal coronary arteries and LV function.  He did have claudication and bilateral iliac disease.  I performed PTA and stenting of his iliac bifurcation using "kissing stent technique 07/11/2007 with excellent result.  Over the last year he is noticed left lower extremity claudication.  He is fairly active.  ?  ?  Since I saw him 2 months ago he did have lower extremity arterial Doppler studies performed 09/10/2021 that showed an occluded left common iliac artery stent with moderate right common iliac artery stenosis.  He wishes to proceed with angiography and reintervention.  He denies chest pain or shortness of breath. ? ?Hospital Course  ?   ?Consultants: N/A  ? ?Patient presented to the hospital on 10/15/2021 for lower extremity angiography given the recent abnormal lower extremity Doppler.  The study revealed occluded L common iliac and  50% ISR in R common iliac artery stent. He was treated with 59mm x 39 mm long VBX stent into the R and L common iliac artery using kissing stent technique. He had filling defect at the distal edge of L common iliac artery stent which was covered with an additional 35mm x 29 mm long VBX stent. During cath, he was loaded with 600mg  plavix. Post procedure, he was kept in the hospital for overnight hydration.  ? ?Patient was seen in the morning of 10/15/2021 by Dr. Ellyn Meyer, cath site is stable without any bleeding or significant hematoma.  He has been ambulating in the room without any significant leg pain.  He has been instructed to hold off on lifting greater than 5 pounds for 1 week.  ? ? ?Did the patient have an acute coronary syndrome (MI, NSTEMI, STEMI, etc) this admission?:  No                               ?Did the patient have a percutaneous coronary intervention (stent / angioplasty)?:  No ?_____________ ? ?Discharge Vitals ?Blood pressure 125/74, pulse 84, temperature 98.1 ?F (36.7 ?C), temperature source Oral, resp. rate 18, height 6\' 3"  (1.905 m), weight 96.2 kg, SpO2 100 %.  ?Filed Weights  ? 10/15/21 1000  ?Weight: 96.2 kg  ?General appearance: alert, cooperative, appears stated age, and no distress ?Neck: no carotid bruit and no JVD ?Lungs: clear to auscultation bilaterally and normal percussion bilaterally ?Heart: regular rate and rhythm, S1, S2 normal, no murmur, click, rub or gallop and normal apical impulse ?Abdomen: soft, non-tender; bowel sounds normal; no masses,  no organomegaly ?Extremities: extremities normal, atraumatic, no cyanosis or edema and Bilateral Femoral access sites with out hematoma or notable bruising ; + bilateral bruits.  ?Pulses: 2+ B Radial/Ulnar - 1+ pedal pulses ?Neurologic: Grossly normal ? ?Labs & Radiologic Studies  ?  ?CBC ?Recent Labs  ?  10/16/21 ?0137  ?WBC 9.2  ?HGB 13.3  ?HCT 39.0  ?MCV 90.1  ?PLT 191  ? ?Basic Metabolic Panel ?Recent Labs  ?  10/16/21 ?0137  ?NA 137  ?K  3.6  ?CL 107  ?CO2 24  ?GLUCOSE 112*  ?BUN 12  ?CREATININE 0.90  ?CALCIUM 8.8*  ? ?Liver Function Tests ?Recent Labs  ?  10/16/21 ?0137  ?AST 25  ?ALT 22  ?ALKPHOS 79  ?BILITOT 0.8  ?PROT 6.2*  ?ALBUMIN 3.4*  ? ?No results for input(s): LIPASE, AMYLASE in the last 72 hours. ?High Sensitivity Troponin:   ?No results for input(s): TROPONINIHS in the last 720 hours.  ?BNP ?Invalid input(s): POCBNP ?D-Dimer ?No results for input(s): DDIMER in the last 72 hours. ?Hemoglobin A1C ?No results for input(s): HGBA1C in the last 72 hours. ?Fasting Lipid Panel ?Recent Labs  ?  10/16/21 ?0137  ?CHOL 108  ?HDL 36*  ?[place] 62  ?TRIG 48  ?CHOLHDL 3.0  ? ?Thyroid Function Tests ?No  results for input(s): TSH, T4TOTAL, T3FREE, THYROIDAB in the last 72 hours. ? ?Invalid input(s): FREET3 ?_____________  ?PERIPHERAL VASCULAR CATHETERIZATION ? ?Result Date: 10/15/2021 ?Images from the original result were not included.  [id number] LOCATION:  FACILITY: [facility] PHYSICIAN: Adam Meyer, M.D. Jun 01, 1971 DATE OF PROCEDURE:  10/15/2021 DATE OF DISCHARGE: PV Angiogram/Intervention History obtained from patient's chart:.Adam Meyer is a 51 y.o.  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  He was referred to me by Dr. Junius Roads, his PCP, for peripheral vascular valuation because of left lower extremity claudication.  I last saw him in the office 08/11/2021.  He basically has no cardiac risk factors.  He does does not smoke.  There is no family history Adam Meyer is never had a heart attack or stroke.  I did do cardiac catheterization on him 07/11/2007 revealing normal coronary arteries and LV function.  He did have claudication and bilateral iliac disease.  I performed PTA and stenting of his iliac bifurcation using "kissing stent technique 07/11/2007 with excellent result.  Over the last year he is noticed left lower extremity claudication.  He is fairly active.  Since I saw him 2 months ago he did  have lower extremity arterial Doppler studies performed 09/10/2021 that showed an occluded left common iliac artery stent with moderate right common iliac artery stenosis.  He wishes to proceed with angi

## 2021-10-16 NOTE — Progress Notes (Signed)
? ?Progress Note ? ?Patient Name: Adam Meyer ?Date of Encounter: 10/16/2021 ? ?[facility] Cardiologist: Nanetta BattyJonathan Berry, MD  ? ?Subjective  ? ?Denies any CP or SOB.  ? ?Inpatient Medications  ?  ?Scheduled Meds: ? aspirin EC  81 mg Oral Daily  ? atorvastatin  40 mg Oral Daily  ? clopidogrel  75 mg Oral Q breakfast  ? lidocaine  1 patch Transdermal Q24H  ? sodium chloride flush  3 mL Intravenous Q12H  ? ?Continuous Infusions: ? sodium chloride    ? ?PRN Meds: ?sodium chloride, acetaminophen, hydrALAZINE, labetalol, morphine injection, ondansetron (ZOFRAN) IV, sodium chloride flush  ? ?Vital Signs  ?  ?Vitals:  ? 10/15/21 2000 10/15/21 2055 10/16/21 0027 10/16/21 0555  ?BP: 118/69 126/81 128/71 125/74  ?Pulse: (!) 101 94 90 84  ?Resp: (!) 23  18 18   ?Temp:  97.8 ?F (36.6 ?C) 99.2 ?F (37.3 ?C) 98.1 ?F (36.7 ?C)  ?TempSrc:  Oral Oral Oral  ?SpO2: 100% 100% 99% 100%  ?Weight:      ?Height:      ? ? ?Intake/Output Summary (Last 24 hours) at 10/16/2021 0901 ?Last data filed at 10/16/2021 0549 ?Gross per 24 hour  ?Intake 835 ml  ?Output 975 ml  ?Net -140 ml  ? ? ?  10/15/2021  ? 10:00 AM 10/07/2021  ?  9:11 AM 08/11/2021  ?  9:39 AM  ?Last 3 Weights  ?Weight (lbs) 212 lb 204 lb 207 lb 12.8 oz  ?Weight (kg) 96.163 kg 92.534 kg 94.257 kg  ?   ? ?Telemetry  ?  ?NSR without significant ventricular ectopy - Personally Reviewed ? ?ECG  ?  ?No EKG obtained during this admission. ? ?Physical Exam  ? ?GEN: No acute distress.   ?Neck: No JVD ?Cardiac: RRR, no murmurs, rubs, or gallops.  ?Respiratory: Clear to auscultation bilaterally. ?GI: Soft, nontender, non-distended  ?MS: No edema; No deformity. ?Neuro:  Nonfocal  ?Psych: Normal affect  ? ?Labs  ?  ?High Sensitivity Troponin:  No results for input(s): TROPONINIHS in the last 720 hours.   ?Chemistry ?Recent Labs  ?Lab 10/16/21 ?0137  ?NA 137  ?K 3.6  ?CL 107  ?CO2 24  ?GLUCOSE 112*  ?BUN 12  ?CREATININE 0.90  ?CALCIUM 8.8*  ?PROT 6.2*  ?ALBUMIN 3.4*  ?AST 25  ?ALT 22  ?ALKPHOS 79   ?BILITOT 0.8  ?GFRNONAA >60  ?ANIONGAP 6  ?  ?Lipids  ?Recent Labs  ?Lab 10/16/21 ?0137  ?CHOL 108  ?TRIG 48  ?HDL 36*  ?LDLCALC 62  ?CHOLHDL 3.0  ?  ?Hematology ?Recent Labs  ?Lab 10/16/21 ?0137  ?WBC 9.2  ?RBC 4.33  ?HGB 13.3  ?HCT 39.0  ?MCV 90.1  ?MCH 30.7  ?MCHC 34.1  ?RDW 13.3  ?PLT 191  ? ?Thyroid No results for input(s): TSH, FREET4 in the last 168 hours.  ?BNPNo results for input(s): BNP, PROBNP in the last 168 hours.  ?DDimer No results for input(s): DDIMER in the last 168 hours.  ? ?Radiology  ?  ?PERIPHERAL VASCULAR CATHETERIZATION ? ?Result Date: 10/15/2021 ?Images from the original result were not included.  [id number] LOCATION:  FACILITY: [facility] PHYSICIAN: Nanetta BattyJonathan Berry, M.D. 12/12/1970 DATE OF PROCEDURE:  10/15/2021 DATE OF DISCHARGE: PV Angiogram/Intervention History obtained from patient's chart:.Adam MinerWillie S Meyer is a 51 y.o.  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  He was referred to me by Dr. Prince RomeHilts, his PCP, for peripheral vascular  valuation because of left lower extremity claudication.  I last saw him in the office 08/11/2021.  He basically has no cardiac risk factors.  He does does not smoke.  There is no family history [name] is never had a heart attack or stroke.  I did do cardiac catheterization on him 07/11/2007 revealing normal coronary arteries and LV function.  He did have claudication and bilateral iliac disease.  I performed PTA and stenting of his iliac bifurcation using "kissing stent technique 07/11/2007 with excellent result.  Over the last year he is noticed left lower extremity claudication.  He is fairly active.  Since I saw him 2 months ago he did have lower extremity arterial Doppler studies performed 09/10/2021 that showed an occluded left common iliac artery stent with moderate right common iliac artery stenosis.  He wishes to proceed with angiography and reintervention.  Pre Procedure Diagnosis: Peripheral arterial disease  Post Procedure Diagnosis: Peripheral arterial disease Operators: Dr. Nanetta Batty Procedures Performed:  1.  Ultrasound-guided right and left common femoral access  2.  Abdominal aortogram/bilateral iliac angiogram  3.  PTA and covered stenting right and left common iliac arteries using "kissing stent technique"  PROCEDURE DESCRIPTION: The patient was brought to the second floor [facility] cath lab in the the postabsorptive state. He was premedicated with IV Versed and fentanyl. His right and left groins were prepped and shaved in usual sterile fashion. Xylocaine 1% was used for local anesthesia. A 5 French sheath was inserted into the right common femoral artery using standard Seldinger technique.  Ultrasound was used to identify the right common femoral artery and guide access.  A 5 French pigtail catheter was placed in distal abdominal aorta.  Distal abdominal aortography and bilateral iliac angiography were performed.  Omnipaque dye was used for the entirety of the case (160 cc total contrast administered to patient).  Retrograde aortic pressures monitored during the case.  A 5 French endhole catheter was used to do a pullback gradient across the right common iliac artery stent after administration of 200 mcg of intra-arterial nitroglycerin revealing a 30 mm pullback gradient suggesting that the 50 to 60% stenosis of "in-stent restenosis" was physiologically significant.  Angiographic Data: 1: Abdominal aorta-the distal abdominal aorta was moderately atherosclerotic 2: Left lower extremity-the left common iliac artery stent was totally occluded throughout its entirety. 3: Right lower extremity-the right common iliac artery stent had a 50% "in-stent restenosis of the 30 mm pullback gradient using endhole catheter and 200 mcg of intra-arterial nitroglycerin.  ? ?Mr. Chavira has an occluded left common iliac artery stent and a hemodynamically significant lesion within the right common iliac artery stent.   He is symptomatic principally on the left.  I am going to proceed with attempting to cross the left common iliac artery CTO and bilateral iliac artery PTA and covered stenting using "kissing stent technique". Procedure Description: I changed the 5 French sheath in the right common femoral artery for a 7 French Brite tip sheath.  I then accessed the left common femoral artery under ultrasound guidance and placed a 7 French Brite tip sheath.  I gave 11,000's of heparin with a ending ACT of 293.  Omnipaque dye was used for the entirety of the intervention.  Retroaortic pressures monitored to the case. I was able to cross the CTO with an 035 straight Navicross endhole catheter along with a 035 stiff angled Glidewire.  I then exchanged this for an 035 versa core wire and predilated within the  left common iliac artery CTO with a 4 mm x 4 cm balloon. I then placed a 7 mm x 39 mm long VBX stents into the right and left common iliac artery stents and deployed them simultaneously using "kissing stent technique.  There was a filling defect at the distal edge of the left common iliac artery stent which I covered with an additional 7 mm x 29 mm long VBX stent.  The final angiographic result result was reduction of a 50% right and a total left common iliac artery stenosis to 0% residual.  Patient tolerated procedure well.  The Brite tip sheath were then exchanged over 0.35 versa core wires for short 7 French sheaths which were then sewn securely in place.  The patient received 600 mg of p.o. Plavix. Final Impression: Successful PTA and covered stenting of bilateral iliac arteries for a left common iliac artery "CTO" using VBX covered stents and "kissing stent technique.  The short 7 French sheath will be removed once the ACT falls below 170 and pressure will be held.  The patient will be hydrated overnight, discharged home tomorrow morning on aspirin and clopidogrel.  We will get aortoiliac Dopplers next week in our [facility] line  office and I will see him back a week or 2 thereafter.  He left the lab in stable condition. Nanetta Batty. MD, [facility] 10/15/2021 3:58 PM    ? ?Cardiac Studies  ? ?LE angiography 10/15/2021 ?Angiographic D

## 2021-10-16 NOTE — Discharge Instructions (Signed)
No lifting over 5 lbs for 1 week. No sexual activity for 1 week. Keep procedure site clean & dry. If you notice increased pain, swelling, bleeding or pus, call/return!  You may shower, but no soaking baths/hot tubs/pools for 1 week °

## 2021-10-16 NOTE — Progress Notes (Signed)
Bilateral groin site dressings changed. Bilateral sites are clean and dry. Advised pt to monitor sites.  ?

## 2021-10-16 NOTE — Plan of Care (Signed)
?  Problem: Education: ?Goal: Knowledge of General Education information will improve ?Description: Including pain rating scale, medication(s)/side effects and non-pharmacologic comfort measures ?Outcome: Completed/Met ?  ?Problem: Health Behavior/Discharge Planning: ?Goal: Ability to manage health-related needs will improve ?Outcome: Completed/Met ?  ?Problem: Clinical Measurements: ?Goal: Ability to maintain clinical measurements within normal limits will improve ?Outcome: Completed/Met ?Goal: Will remain free from infection ?Outcome: Completed/Met ?Goal: Diagnostic test results will improve ?Outcome: Completed/Met ?Goal: Respiratory complications will improve ?Outcome: Completed/Met ?Goal: Cardiovascular complication will be avoided ?Outcome: Completed/Met ?  ?Problem: Nutrition: ?Goal: Adequate nutrition will be maintained ?Outcome: Completed/Met ?  ?Problem: Activity: ?Goal: Risk for activity intolerance will decrease ?Outcome: Completed/Met ?  ?Problem: Coping: ?Goal: Level of anxiety will decrease ?Outcome: Completed/Met ?  ?

## 2021-10-29 ENCOUNTER — Ambulatory Visit (HOSPITAL_BASED_OUTPATIENT_CLINIC_OR_DEPARTMENT_OTHER)
Admission: RE | Admit: 2021-10-29 | Discharge: 2021-10-29 | Disposition: A | Discharge status: Home / Self Care | Payer: PRIVATE HEALTH INSURANCE | Source: Ambulatory Visit | Attending: Cardiovascular Disease | Admitting: Cardiovascular Disease

## 2021-10-29 ENCOUNTER — Ambulatory Visit (HOSPITAL_COMMUNITY)
Admission: RE | Admit: 2021-10-29 | Discharge: 2021-10-29 | Disposition: A | Discharge status: Home / Self Care | Payer: PRIVATE HEALTH INSURANCE | Source: Ambulatory Visit | Attending: Internal Medicine | Admitting: Internal Medicine

## 2021-10-29 DIAGNOSIS — I739 Peripheral vascular disease, unspecified: Secondary | ICD-10-CM

## 2021-10-29 DIAGNOSIS — Z95820 Peripheral vascular angioplasty status with implants and grafts: Secondary | ICD-10-CM

## 2021-10-29 DIAGNOSIS — E782 Mixed hyperlipidemia: Secondary | ICD-10-CM | POA: Insufficient documentation

## 2021-11-03 ENCOUNTER — Other Ambulatory Visit: Payer: Self-pay

## 2021-11-03 DIAGNOSIS — I739 Peripheral vascular disease, unspecified: Secondary | ICD-10-CM

## 2021-11-06 ENCOUNTER — Ambulatory Visit: Payer: PRIVATE HEALTH INSURANCE | Admitting: Cardiovascular Disease

## 2021-11-17 ENCOUNTER — Encounter: Payer: Self-pay | Admitting: Cardiovascular Disease

## 2021-11-17 ENCOUNTER — Ambulatory Visit (INDEPENDENT_AMBULATORY_CARE_PROVIDER_SITE_OTHER): Payer: PRIVATE HEALTH INSURANCE | Admitting: Cardiovascular Disease

## 2021-11-17 DIAGNOSIS — I739 Peripheral vascular disease, unspecified: Secondary | ICD-10-CM | POA: Diagnosis not present

## 2021-11-17 NOTE — Patient Instructions (Signed)
Medication Instructions:  Your physician recommends that you continue on your current medications as directed. Please refer to the Current Medication list given to you today.  *If you need a refill on your cardiac medications before your next appointment, please call your pharmacy*   Testing/Procedures: Dr. [name] has recommended that you have an Ultrasound of your AORTA/IVC/ILIACS.   To prepare for this test:  No food after 11PM the night before. Water is OK. (Don't drink liquids if you have been instructed not to for ANOTHER test).  Avoid foods that produce bowel gas, for 24 hours prior to exam (see below). No breakfast, no chewing gum, no smoking or carbonated beverages. Patient may take morning medications with water. Come in for test at least 15 minutes early to register.  Your physician has requested that you have an ankle brachial index (ABI). During this test an ultrasound and blood pressure cuff are used to evaluate the arteries that supply the arms and legs with blood. Allow thirty minutes for this exam. There are no restrictions or special instructions. To be done in November. These procedures will be done at [address] [facility] [address]   Follow-Up: At [facility], you and your health needs are our priority.  As part of our continuing mission to provide you with exceptional heart care, we have created designated Provider Care Teams.  These Care Teams include your primary Cardiologist (physician) and Advanced Practice Providers (APPs -  Physician Assistants and Nurse Practitioners) who all work together to provide you with the care you need, when you need it.  We recommend signing up for the patient portal called "MyChart".  Sign up information is provided on this After Visit Summary.  MyChart is used to connect with patients for Virtual Visits (Telemedicine).  Patients are able to view lab/test results, encounter notes, upcoming appointments, etc.  Non-urgent messages can be sent  to your provider as well.   To learn more about what you can do with MyChart, go to [email or web address].    Your next appointment:   12 month(s)  The format for your next appointment:   In Person  Provider:   Nanetta Batty, MD

## 2021-11-17 NOTE — Assessment & Plan Note (Signed)
History of peripheral arterial disease status post bilateral iliac stenting using "kissing stent technique" by myself 07/11/2007 with an excellent result.  Because of recurrent claudication he had Dopplers that showed an occluded left iliac 09/10/2021.  I ultimately performed angiography on him 5//23 revealing an occluded left common iliac artery stent with high-grade "in-stent restenosis" within the previously placed right common iliac artery stent.  I restented both iliacs using VBX covered stents with an excellent angiographic result.  His Dopplers normalized and his claudication has resolved.  He remains on aspirin and clopidogrel.  We will recheck aortoiliac Dopplers in 6 months and I will see him back in 1 year for follow-up.

## 2021-11-17 NOTE — Progress Notes (Signed)
11/17/2021 Adam Meyer   1970-09-10  [id number]  Primary Physician Pcp, No Primary Cardiologist: Runell Gess MD Nicholes Calamity, [place]  HPI:  Adam Meyer is a 51 y.o.  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  He was referred to me by Dr. Prince Rome, his PCP, for peripheral vascular valuation because of left lower extremity claudication.  I last saw him in the office 10/07/2021.  He basically has no cardiac risk factors.  He does does not smoke.  There is no family history [name] is never had a heart attack or stroke.  I did do cardiac catheterization on him 07/11/2007 revealing normal coronary arteries and LV function.  He did have claudication and bilateral iliac disease.  I performed PTA and stenting of his iliac bifurcation using "kissing stent technique 07/11/2007 with excellent result.  Over the last year he is noticed left lower extremity claudication.  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   Since I saw him 2 months ago I did perform lower extremity angiography on him 5//23 after Doppler studies performed 09/10/2021 showed occluded left common iliac with high-grade right common iliac artery stenosis.  His angiogram confirmed this.  I restented both iliac arteries with VBX covered stents.  His Dopplers normalized and his claudication has resolved.   Current Meds  Medication Sig   aspirin 81 MG chewable tablet Chew 81 mg by mouth daily.   atorvastatin (LIPITOR) 40 MG tablet Take 1 tablet (40 mg total) by mouth daily.   clopidogrel (PLAVIX) 75 MG tablet Take 1 tablet (75 mg total) by mouth daily with breakfast.   Multiple Vitamin (MULTIVITAMIN WITH MINERALS) TABS tablet Take 1 tablet by mouth daily. 50 +     No Known Allergies  Social History   Socioeconomic History   Marital status: Divorced    Spouse name: Not on file   Number of children: Not on file   Years of education: Not on file   Highest  education level: Not on file  Occupational History   Not on file  Tobacco Use   Smoking status: Never   Smokeless tobacco: Never  Substance and Sexual Activity   Alcohol use: Not Currently   Drug use: Yes    Types: Marijuana    Comment: WEEKLY   Sexual activity: Yes    Partners: Female  Other Topics Concern   Not on file  Social History Narrative   Not on file   Social Determinants of Health   Financial Resource Strain: Not on file  Food Insecurity: Not on file  Transportation Needs: Not on file  Physical Activity: Not on file  Stress: Not on file  Social Connections: Not on file  Intimate Partner Violence: Not on file     Review of Systems: General: negative for chills, fever, night sweats or weight changes.  Cardiovascular: negative for chest pain, dyspnea on exertion, edema, orthopnea, palpitations, paroxysmal nocturnal dyspnea or shortness of breath Dermatological: negative for rash Respiratory: negative for cough or wheezing Urologic: negative for hematuria Abdominal: negative for nausea, vomiting, diarrhea, bright red blood per rectum, melena, or hematemesis Neurologic: negative for visual changes, syncope, or dizziness All other systems reviewed and are otherwise negative except as noted above.    Blood pressure 112/70, pulse 93, height 6\' 3"  (1.905 m), weight 203 lb 9.6 oz (92.4 kg), SpO2 98 %.  General appearance: alert and  no distress Neck: no adenopathy, no carotid bruit, no JVD, supple, symmetrical, trachea midline, and thyroid not enlarged, symmetric, no tenderness/mass/nodules Lungs: clear to auscultation bilaterally Heart: regular rate and rhythm, S1, S2 normal, no murmur, click, rub or gallop Extremities: extremities normal, atraumatic, no cyanosis or edema Pulses: 2+ and symmetric Skin: Skin color, texture, turgor normal. No rashes or lesions Neurologic: Grossly normal  EKG not performed today  ASSESSMENT AND PLAN:   Peripheral arterial disease  (HCC) History of peripheral arterial disease status post bilateral iliac stenting using "kissing stent technique" by myself 07/11/2007 with an excellent result.  Because of recurrent claudication he had Dopplers that showed an occluded left iliac 09/10/2021.  I ultimately performed angiography on him 5//23 revealing an occluded left common iliac artery stent with high-grade "in-stent restenosis" within the previously placed right common iliac artery stent.  I restented both iliacs using VBX covered stents with an excellent angiographic result.  His Dopplers normalized and his claudication has resolved.  He remains on aspirin and clopidogrel.  We will recheck aortoiliac Dopplers in 6 months and I will see him back in 1 year for follow-up.     Runell Gess MD FACP,FACC,FAHA, [facility] 11/17/2021 9:19 AM

## 2021-12-04 ENCOUNTER — Ambulatory Visit: Payer: PRIVATE HEALTH INSURANCE | Admitting: Cardiovascular Disease

## 2022-01-20 ENCOUNTER — Telehealth: Payer: Self-pay | Admitting: Cardiovascular Disease

## 2022-01-20 MED ORDER — CLOPIDOGREL BISULFATE 75 MG PO TABS: 75.0000 mg | ORAL_TABLET | Freq: Every day | ORAL | 3 refills | Status: DC

## 2022-01-20 NOTE — Telephone Encounter (Signed)
*  STAT* If patient is at the pharmacy, call can be transferred to refill team.   1. Which medications need to be refilled? (please list name of each medication and dose if known)  clopidogrel (PLAVIX) 75 MG tablet  2. Which pharmacy/location (including street and city if local pharmacy) is medication to be sent to? [facility] - [address] [place], [place] [address]  3. Do they need a 30 day or 90 day supply?  90 day supply  Patient is requesting to have current Rx transferred to the pharmacy listed above.

## 2022-02-23 ENCOUNTER — Ambulatory Visit (HOSPITAL_BASED_OUTPATIENT_CLINIC_OR_DEPARTMENT_OTHER): Payer: PRIVATE HEALTH INSURANCE | Admitting: Internal Medicine

## 2022-05-20 ENCOUNTER — Ambulatory Visit (HOSPITAL_BASED_OUTPATIENT_CLINIC_OR_DEPARTMENT_OTHER)
Admission: RE | Admit: 2022-05-20 | Discharge: 2022-05-20 | Disposition: A | Discharge status: Home / Self Care | Payer: PRIVATE HEALTH INSURANCE | Source: Ambulatory Visit | Attending: Cardiovascular Disease | Admitting: Cardiovascular Disease

## 2022-05-20 ENCOUNTER — Ambulatory Visit (HOSPITAL_COMMUNITY)
Admission: RE | Admit: 2022-05-20 | Discharge: 2022-05-20 | Disposition: A | Discharge status: Home / Self Care | Payer: PRIVATE HEALTH INSURANCE | Source: Ambulatory Visit | Attending: Cardiovascular Disease | Admitting: Cardiovascular Disease

## 2022-05-20 DIAGNOSIS — I739 Peripheral vascular disease, unspecified: Secondary | ICD-10-CM | POA: Diagnosis not present

## 2022-05-20 DIAGNOSIS — Z95828 Presence of other vascular implants and grafts: Secondary | ICD-10-CM | POA: Diagnosis not present

## 2022-10-11 IMAGING — MR MR CERVICAL SPINE W/O CM
5 series · 34 of 48 positions shown · non-contrast
Comparison: None.

CLINICAL DATA: Pain into the right arm

EXAM:
MRI CERVICAL SPINE WITHOUT CONTRAST
TECHNIQUE: Multiplanar, multisequence MR imaging of the cervical spine was
performed. No intravenous contrast was administered.

[Series 2: T2 · sagittal · 3.0mm · 0.41mm/px · 8 of 17 slices shown (1 of 2)]
[im 1/17]
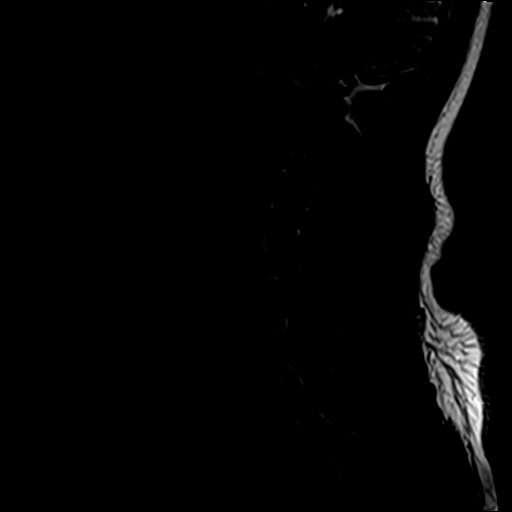
[im 3/17]
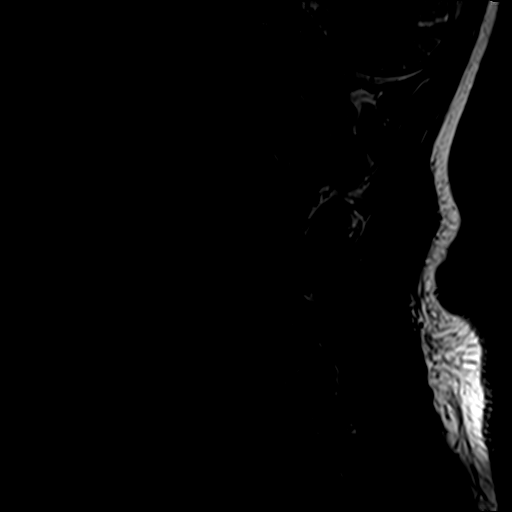
[im 5/17]
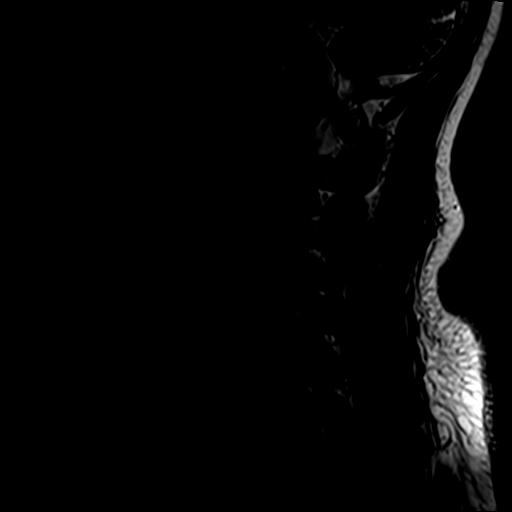
[im 7/17]
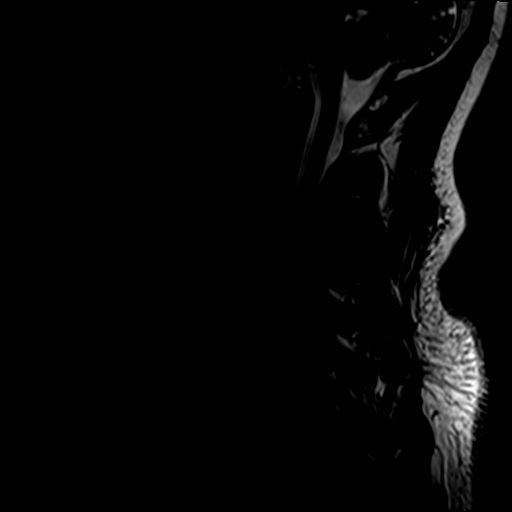
[im 10/17]
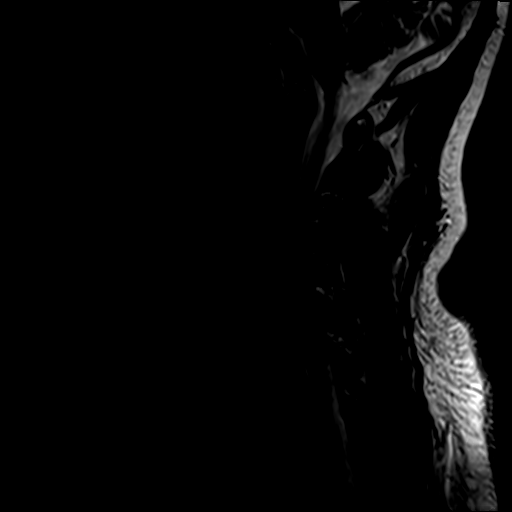
[im 12/17]
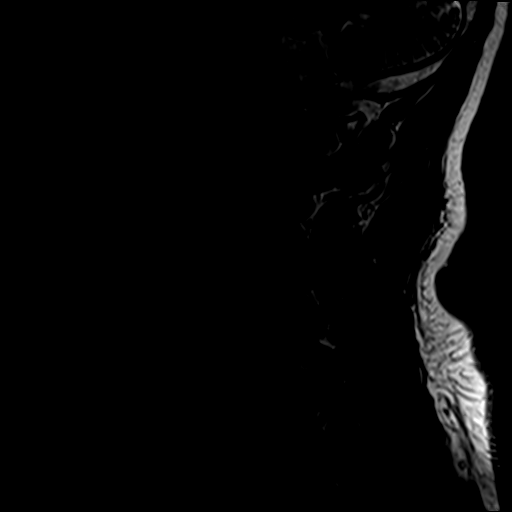
[im 14/17]
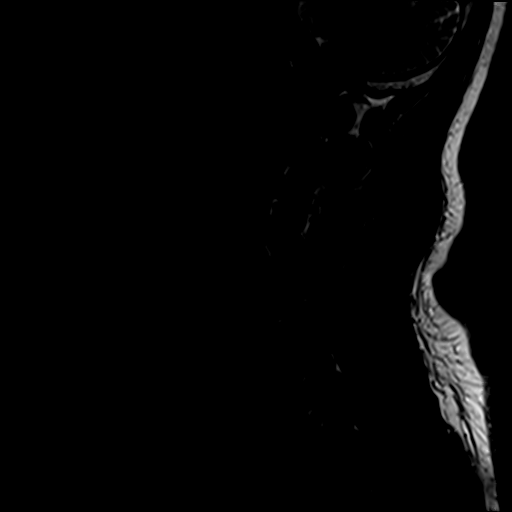
[im 17/17]
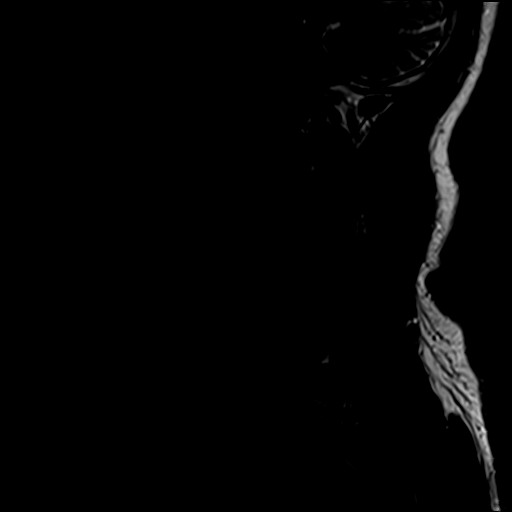

[Series 3: STIR · sagittal · 3.0mm · 0.82mm/px · 7 of 17 slices shown]
[im 1/17]
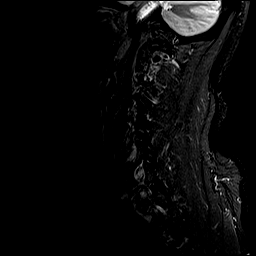
[im 3/17]
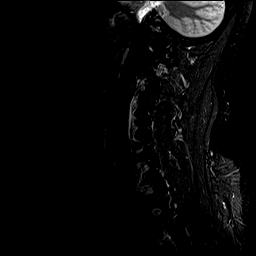
[im 6/17]
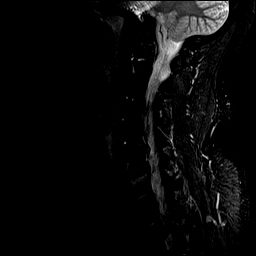
[im 9/17]
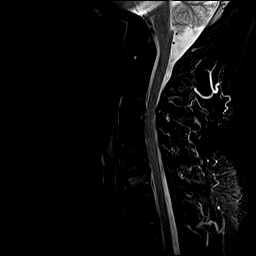
[im 11/17]
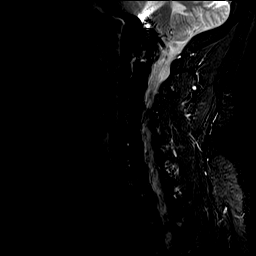
[im 14/17]
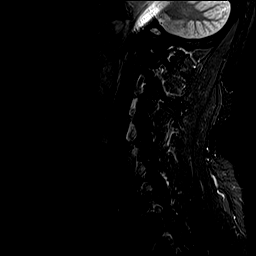
[im 17/17]
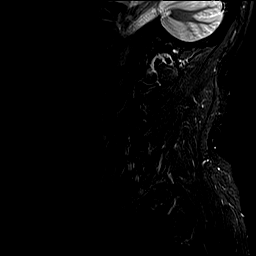

[Series 4: T1 · sagittal · 3.0mm · 0.82mm/px · 7 of 17 slices shown]
[im 1/17]
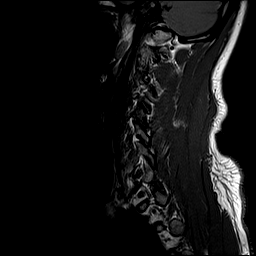
[im 3/17]
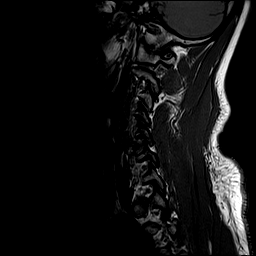
[im 6/17]
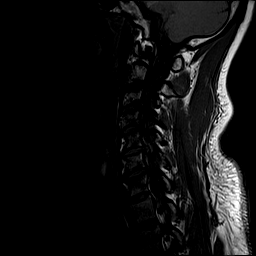
[im 9/17]
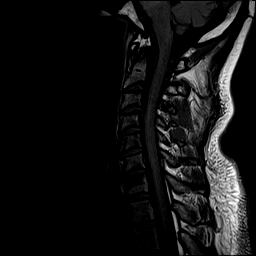
[im 11/17]
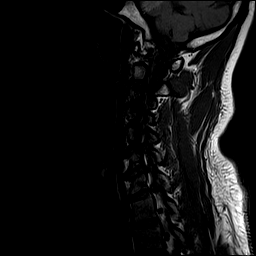
[im 14/17]
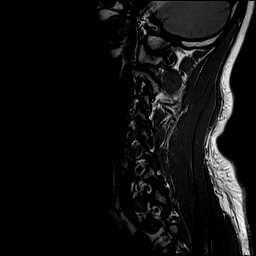
[im 17/17]
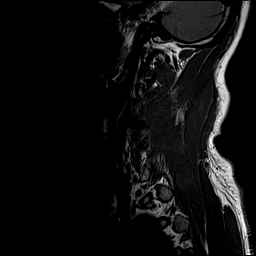

[Series 5: T2 · axial · 3.0mm · 0.70mm/px · z∈[-74,+31]mm · 9 of 29 slices shown (2 of 2)]
[im 1/29]
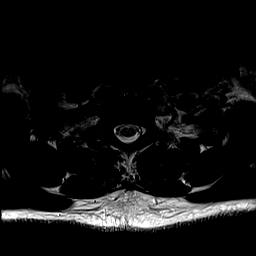
[im 5/29]
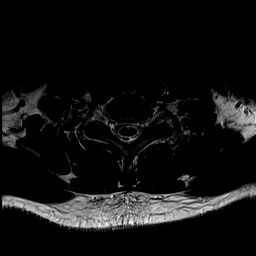
[im 10/29]
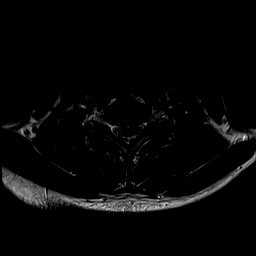
[im 12/29]
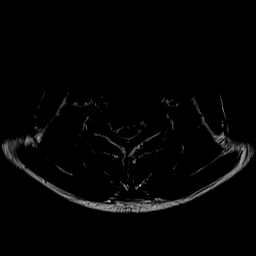
[im 15/29]
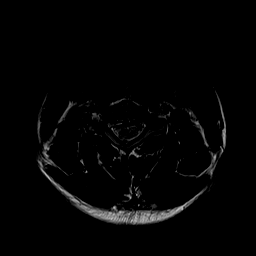
[im 17/29]
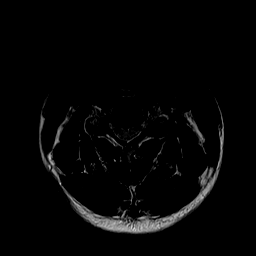
[im 19/29]
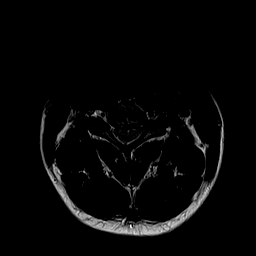
[im 24/29]
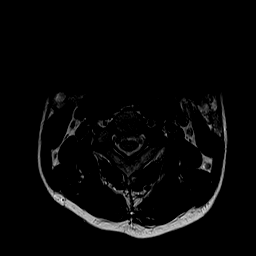
[im 29/29]
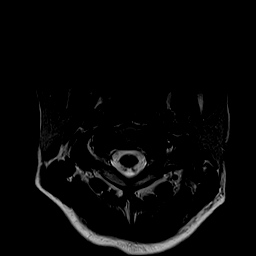

[Series 6: GRE · axial · 3.0mm · 0.35mm/px · z∈[-74,-41]mm · 3 of 30 slices shown]
[im 1/30]
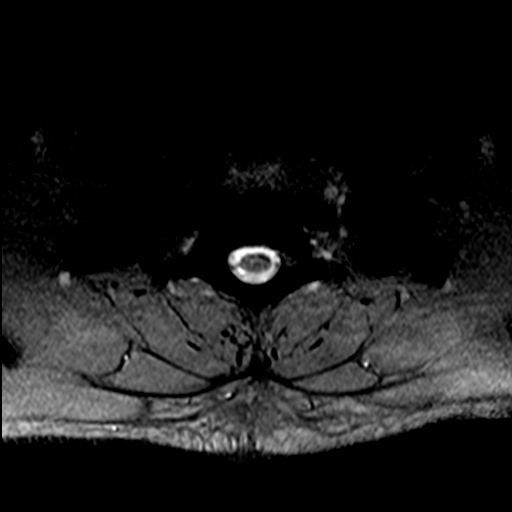
[im 5/30]
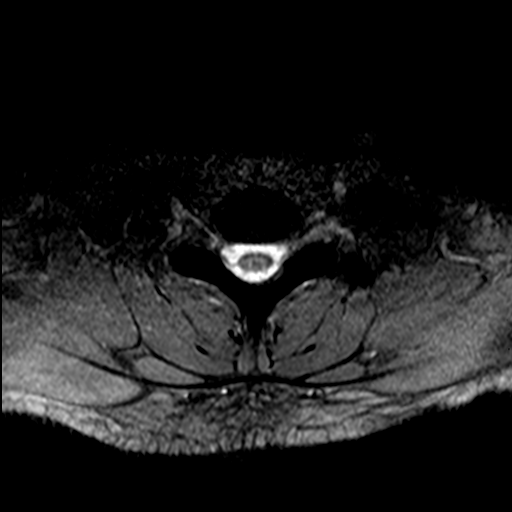
[im 10/30]
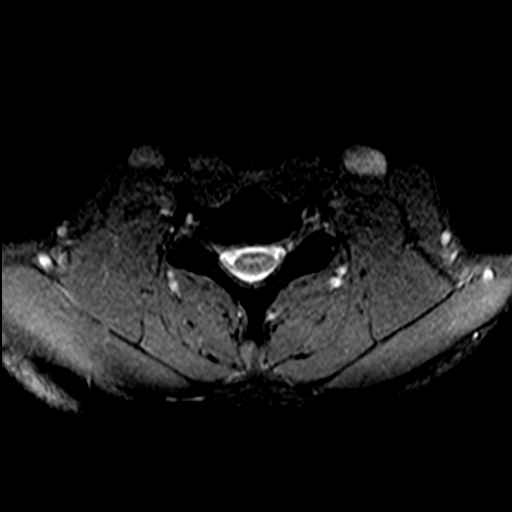

[34 of 48 positions shown; findings below may reference images not displayed]

FINDINGS: Alignment: There is straightening of the normal cervical lordosis.

Vertebrae: The vertebral body heights are well maintained. No
fracture, marrow edema,or pathologic marrow infiltration. Endplate
reactive changes are seen at C6-C7.

Cord: Normal signal and morphology.

Posterior Fossa, vertebral arteries, paraspinal tissues:

The visualized portion of the posterior fossa is unremarkable.
Normal flow voids seen within the vertebral arteries. The paraspinal
soft tissues are unremarkable.

Disc levels:

C1-C2: Atlanto-axial junction is normal, without canal narrowing

C2-C3: No significant spinal canal or neural foraminal narrowing

C3-C4: There is a disc osteophyte complex and uncovertebral
osteophytes which causes severe bilateral neural foraminal narrowing
and mild central canal stenosis.

C4-C5: There is a disc osteophyte complex with a tiny central disc
protrusion which causes moderate bilateral neural foraminal
narrowing and mild central canal stenosis.

C5-C6: There is a disc osteophyte complex and a right lateral recess
disc protrusion which contacts and impinges the right C6 nerve root.
There is severe right and moderate left neural foraminal narrowing
and mild central canal stenosis.

C6-C7: There is a disc osteophyte complex and uncovertebral
osteophytes which causes moderate bilateral neural foraminal
narrowing and mild central canal stenosis.

C7-T1: No significant spinal canal or neural foraminal narrowing
IMPRESSION: Cervical spine spondylosis most notable C5-C6 with a right lateral
recess disc protrusion contacting and impinging the right C6 nerve
root. There is severe right and moderate left neural foraminal
narrowing and mild central canal stenosis.

## 2022-10-27 ENCOUNTER — Ambulatory Visit (INDEPENDENT_AMBULATORY_CARE_PROVIDER_SITE_OTHER): Payer: PRIVATE HEALTH INSURANCE | Admitting: Physician Assistant

## 2022-10-27 ENCOUNTER — Other Ambulatory Visit (INDEPENDENT_AMBULATORY_CARE_PROVIDER_SITE_OTHER): Payer: PRIVATE HEALTH INSURANCE

## 2022-10-27 ENCOUNTER — Encounter: Payer: Self-pay | Admitting: Physician Assistant

## 2022-10-27 DIAGNOSIS — M542 Cervicalgia: Secondary | ICD-10-CM | POA: Diagnosis not present

## 2022-10-27 NOTE — Progress Notes (Addendum)
Office Visit Note   Patient: Adam Meyer           Date of Birth: 17-Aug-1970           MRN: [id number] Visit Date: 10/27/2022              Requested by: No referring provider defined for this encounter. PCP: Pcp, No   Assessment & Plan: Visit Diagnoses:  1. Neck pain     Plan: Pleasant 52 year old gentleman with a history of right shoulder impingement.  Is a former patient of Dr. Prince Rome . He comes in today complaining of what he feels are separate issues in a coming from his neck.  He has had episodes of pain that shoots down his left arm.  This happened once when he was walking.  He said he denies any shortness of breath or chest pain.  H  He actually said he walked faster had no shortness of breath and the pain resolved.  It felt like nerves to him.  Has had no reoccurrence no injury.  Now he gets some shooting pain down his right arm.  He denies any neck pain just tightness.  He has seen Dr. Prince Rome in the past with a MRI of his cervical neck which did show degenerative changes.  I have explained to him that this certainly can be a combination of his shoulder issues and the arthritis in his neck.  He has not done any focused PT for his neck.  He does work out on his own.  I have encouraged him to try some physical therapy for his neck.  He does display some impingement symptoms in the right shoulder today as well I encouraged him to try another injection in the shoulder but he has declined.  Have him follow-up with Dr. Ophelia Charter after he has done his therapy.  I have also told him that if he gets recurrence of the left-sided symptoms I would encourage him to follow-up with his cardiologist.  Of course if this gets acute he is to go to the emergency room  Follow-Up Instructions: 1 month with Dr. Ophelia Charter  Orders:  Orders Placed This Encounter  Procedures   XR Cervical Spine 2 or 3 views   Ambulatory referral to Physical Therapy   No orders of the defined types were placed in this  encounter.     Procedures: No procedures performed   Clinical Data: No additional findings.   Subjective: Chief Complaint  Patient presents with   Neck - Pain    HPI pleasant 52 year old gentleman who presents today with what he thinks is neck pain.  No particular injury.  Says it radiates into his right shoulder.  He does have a history of impingement in his right shoulder and has had injections and therapy.  He thinks this is different.  He does have a cardiac history and did have a recent episode of walking and had pain that ran down his left arm but no shortness of breath or chest pain.  He cannot take anti-inflammatories because he is on blood thinners  Review of Systems  All other systems reviewed and are negative.    Objective: Vital Signs: There were no vitals taken for this visit.  Physical Exam Constitutional:      Appearance: Normal appearance.  Pulmonary:     Effort: Pulmonary effort is normal.  Neurological:     General: No focal deficit present.     Mental Status: He is alert.  Ortho Exam Examination he has good range of motion of his neck does not reproduce any radicular findings.  No stiffness.  He has good strength with triceps biceps and abduction.  He does have positive empty can test.  He is neurovascularly intact today with good grip strength Specialty Comments:  No specialty comments available.  Imaging: XR Cervical Spine 2 or 3 views  Result Date: 10/27/2022 2 views of the cervical spine demonstrate overall well-maintained alignment  he has degenerative changes at C5-C6 C6-C7 with osteophyte formation and sclerotic changes    PMFS History: Patient Active Problem List   Diagnosis Date Noted   Neck pain 10/27/2022   Claudication in peripheral vascular disease (HCC) 10/15/2021   Hyperlipidemia 08/11/2021   Peripheral arterial disease (HCC) 10/10/2020   History reviewed. No pertinent past medical history.  History reviewed. No pertinent  family history.  Past Surgical History:  Procedure Laterality Date   ABDOMINAL AORTOGRAM W/LOWER EXTREMITY Bilateral 10/15/2021   Procedure: ABDOMINAL AORTOGRAM W/LOWER EXTREMITY;  Surgeon: Runell Gess, MD;  Location: [facility];  Service: Cardiovascular;  Laterality: Bilateral;  Limited Study   PERIPHERAL VASCULAR INTERVENTION Bilateral 10/15/2021   Procedure: PERIPHERAL VASCULAR INTERVENTION;  Surgeon: Runell Gess, MD;  Location: [facility];  Service: Cardiovascular;  Laterality: Bilateral;  Iliac   Social History   Occupational History   Not on file  Tobacco Use   Smoking status: Never   Smokeless tobacco: Never  Substance and Sexual Activity   Alcohol use: Not Currently   Drug use: Yes    Types: Marijuana    Comment: WEEKLY   Sexual activity: Yes    Partners: Female

## 2022-11-04 ENCOUNTER — Telehealth: Payer: Self-pay | Admitting: Physician Assistant

## 2022-11-04 NOTE — Telephone Encounter (Signed)
Can you please advise? Dr Ophelia Charter has not seen patient yet.

## 2022-11-04 NOTE — Telephone Encounter (Signed)
Pt called requesting pain medication for his neck. He state pain is unbearable and need something until appt with Dr Ophelia Charter. Please send script to [facility] on [address]. Pt phone number is [phone].

## 2022-11-09 NOTE — Telephone Encounter (Signed)
I called, no answer.  ?

## 2022-11-30 ENCOUNTER — Ambulatory Visit: Payer: PRIVATE HEALTH INSURANCE | Admitting: Orthopaedic Surgery

## 2023-09-05 ENCOUNTER — Telehealth: Payer: Self-pay | Admitting: Cardiovascular Disease

## 2023-09-05 DIAGNOSIS — I739 Peripheral vascular disease, unspecified: Secondary | ICD-10-CM

## 2023-09-05 NOTE — Telephone Encounter (Signed)
 Pt stating that after taking a walk and showering his left left went numb where is stint is located. He would like to know if he needs a Ultrasound. Please advise

## 2023-09-05 NOTE — Telephone Encounter (Signed)
 Patient identification verified by 2 forms. Shade Flood, RN     Called and spoke to patient  Patient states:  - Took his dogs for a walk. After the walk when he returned to his car  his lower back on left side felt really stiff.  - Once home patient took a bath to soak after the walk. He elevated his legs in the tub. Started to feel tingling in his left toes that moved up his leg into his hip and back.  - hip and back began to feel numb. This feeling lasted 20 min and then went away on its own  - worried his circulation is not appropriate and stent may have collapsed. He would like dopplers ordered for his upcoming appt.    Patient denies:  - blurred vision, headache, slurred speech, chest pain, difficulty with coordination/balance             Interventions/Plan: - Encounter forwarded to primary cardiologist for review.    Reviewed ED warning signs/precautions  Patient agrees with plan, no questions at this time

## 2023-09-06 NOTE — Telephone Encounter (Signed)
 Patient identification verified by 2 forms. Shade Flood, RN     Patient called back today to f/u  Patient states:  - he has been taking it easy and leg pain is getting worst   Patient denies:  - SOB, dizziness, headache, vision changes, weakness              Interventions/Plan: - See previous encounter. Orders placed to have stents evaluated   Patient agrees with plan, no questions at this time

## 2023-09-20 ENCOUNTER — Ambulatory Visit: Attending: Cardiovascular Disease | Admitting: Cardiovascular Disease

## 2023-09-20 ENCOUNTER — Encounter: Payer: Self-pay | Admitting: Cardiovascular Disease

## 2023-09-20 VITALS — BP 128/73 | HR 74 | Ht 75.0 in | Wt 224.2 lb

## 2023-09-20 DIAGNOSIS — R0989 Other specified symptoms and signs involving the circulatory and respiratory systems: Secondary | ICD-10-CM

## 2023-09-20 DIAGNOSIS — I739 Peripheral vascular disease, unspecified: Secondary | ICD-10-CM | POA: Diagnosis not present

## 2023-09-20 DIAGNOSIS — E782 Mixed hyperlipidemia: Secondary | ICD-10-CM | POA: Diagnosis not present

## 2023-09-20 NOTE — Progress Notes (Signed)
 09/20/2023 Adam Meyer   [birth year]  [id number]  Primary Physician Pcp, No Primary Cardiologist: Runell Gess MD Adam Meyer, Adam Meyer, Adam Meyer  HPI:  Adam Meyer is a 52 y.o.  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  He was referred to me by Dr. Prince Rome, his PCP, for peripheral vascular valuation because of left lower extremity claudication.  I last saw him in the office 11/17/2021.  He basically has no cardiac risk factors.  He does does not smoke.  There is no family history [name] is never had a heart attack or stroke.  I did do cardiac catheterization on him 07/11/2007 revealing normal coronary arteries and LV function.  He did have claudication and bilateral iliac disease.  I performed PTA and stenting of his iliac bifurcation using "kissing stent technique 07/11/2007 with excellent result.  Over the last year he is noticed left lower extremity claudication.  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   I performed lower extremity angiography on him 10/15/21 after Doppler studies performed 09/10/2021 showed occluded left common iliac with high-grade right common iliac artery stenosis.  His angiogram confirmed this.  I restented both iliac arteries with VBX covered stents.  His Dopplers normalized and his claudication has resolved.  His follow-up postprocedure Dopplers performed 10/29/2021 revealed patent stents.  Approximately 2 weeks ago walking his dog he had acute onset of left lower back and hip discomfort and has had difficulty ambulating since.  He does have a history of lumbar disc disease as well.   Current Meds  Medication Sig   aspirin 81 MG chewable tablet Chew 81 mg by mouth daily.   clopidogrel (PLAVIX) 75 MG tablet Take 1 tablet (75 mg total) by mouth daily with breakfast.   Multiple Vitamin (MULTIVITAMIN WITH MINERALS) TABS tablet Take 1 tablet by mouth daily. 50 +     No Known Allergies  Social  History   Socioeconomic History   Marital status: Divorced    Spouse name: Not on file   Number of children: Not on file   Years of education: Not on file   Highest education level: Not on file  Occupational History   Not on file  Tobacco Use   Smoking status: Never   Smokeless tobacco: Never  Substance and Sexual Activity   Alcohol use: Not Currently   Drug use: Yes    Types: Marijuana    Comment: WEEKLY   Sexual activity: Yes    Partners: Female  Other Topics Concern   Not on file  Social History Narrative   Not on file   Social Drivers of Health   Financial Resource Strain: Not on file  Food Insecurity: Not on file  Transportation Needs: Not on file  Physical Activity: Not on file  Stress: Not on file  Social Connections: Not on file  Intimate Partner Violence: Not on file     Review of Systems: General: negative for chills, fever, night sweats or weight changes.  Cardiovascular: negative for chest pain, dyspnea on exertion, edema, orthopnea, palpitations, paroxysmal nocturnal dyspnea or shortness of breath Dermatological: negative for rash Respiratory: negative for cough or wheezing Urologic: negative for hematuria Abdominal: negative for nausea, vomiting, diarrhea, bright red blood per rectum, melena, or hematemesis Neurologic: negative for visual changes, syncope, or dizziness All other systems reviewed and are otherwise negative except as noted above.  Blood pressure 128/73, pulse 74, height 6\' 3"  (1.905 m), weight 224 lb 3.2 oz (101.7 kg), SpO2 99%.  General appearance: alert and no distress Neck: no adenopathy, no JVD, supple, symmetrical, trachea midline, thyroid not enlarged, symmetric, no tenderness/mass/nodules, and left carotid bruit Lungs: clear to auscultation bilaterally Heart: regular rate and rhythm, S1, S2 normal, no murmur, click, rub or gallop Extremities: extremities normal, atraumatic, no cyanosis or edema Pulses: Decreased left common  femoral pulse Skin: Skin color, texture, turgor normal. No rashes or lesions Neurologic: Grossly normal  EKG EKG Interpretation Date/Time:  Tuesday September 20 2023 09:29:14 EDT Ventricular Rate:  74 PR Interval:  144 QRS Duration:  76 QT Interval:  364 QTC Calculation: 404 R Axis:   12  Text Interpretation: Normal sinus rhythm Normal ECG When compared with ECG of 14-Jul-2007 13:05, ST no longer depressed in Inferior leads QT has shortened Confirmed by Nanetta Batty [phone]) on 09/20/2023 9:52:55 AM    ASSESSMENT AND PLAN:   Peripheral arterial disease (HCC) History of peripheral arterial disease status post bilateral iliac stenting using "kissing stent technique" 07/11/2007 with excellent result.  Because of recurrent symptoms and Dopplers that suggested an occluded left common iliac artery with high-grade right common iliac artery stenosis I restudied him 5//23 revealing an occluded left common iliac artery stent which I restented using a 7 mm x 29 mm long VBX stent.  I also restented his right common iliac artery stent for 60% "in-stent restenosis with the same VBX stent.  His Dopplers normalized and his symptoms resolved.  His postprocedure Dopplers performed 10/29/2021 showed normal velocities.  Approxi-2 weeks ago walking his dog he had acute onset of left flank pain and now has left lower extremity discomfort when ambulating specifically in his hip and upper thigh.  He does have a 2+ right femoral pelvic pulse and has somewhat decreased left femoral pulse.  Going to get lower extremity arterial Dopplers/aortoiliac Dopplers to further evaluate.  Hyperlipidemia History of hyperlipidemia on statin therapy with lipid profile performed 10/16/2020 revealing a total cholesterol 108, LDL 62 and HDL 36.     Runell Gess MD [facility], [facility] 09/20/2023 10:07 AM

## 2023-09-20 NOTE — Assessment & Plan Note (Signed)
 History of peripheral arterial disease status post bilateral iliac stenting using "kissing stent technique" 07/11/2007 with excellent result.  Because of recurrent symptoms and Dopplers that suggested an occluded left common iliac artery with high-grade right common iliac artery stenosis I restudied him 5//23 revealing an occluded left common iliac artery stent which I restented using a 7 mm x 29 mm long VBX stent.  I also restented his right common iliac artery stent for 60% "in-stent restenosis with the same VBX stent.  His Dopplers normalized and his symptoms resolved.  His postprocedure Dopplers performed 10/29/2021 showed normal velocities.  Approxi-2 weeks ago walking his dog he had acute onset of left flank pain and now has left lower extremity discomfort when ambulating specifically in his hip and upper thigh.  He does have a 2+ right femoral pelvic pulse and has somewhat decreased left femoral pulse.  Going to get lower extremity arterial Dopplers/aortoiliac Dopplers to further evaluate.

## 2023-09-20 NOTE — Assessment & Plan Note (Signed)
 History of hyperlipidemia on statin therapy with lipid profile performed 10/16/2020 revealing a total cholesterol 108, LDL 62 and HDL 36.

## 2023-09-20 NOTE — Patient Instructions (Addendum)
   Testing/Procedures:  Your physician has requested that you have a lower extremity arterial exercise duplex. During this test, exercise and ultrasound are used to evaluate arterial blood flow in the legs. Allow one hour for this exam. There are no restrictions or special instructions. THIS WEEK  Your physician has requested that you have an Aorta/Iliac Duplex. This will be take place at [address] [facility], [address].  No food after 11PM the night before.  Water is OK. (Don't drink liquids if you have been instructed not to for ANOTHER test) Avoid foods that produce bowel gas, for 24 hours prior to exam (see below). No breakfast, no chewing gum, no smoking or carbonated beverages. Patient may take morning medications with water. Come in for test at least 15 minutes early to register.  Please note: We ask at that you not bring children with you during ultrasound (echo/ vascular) testing. Due to room size and safety concerns, children are not allowed in the ultrasound rooms during exams. Our front office staff cannot provide observation of children in our lobby area while testing is being conducted. An adult accompanying a patient to their appointment will only be allowed in the ultrasound room at the discretion of the ultrasound technician under special circumstances. We apologize for any inconvenience. THIS WEEK   Your physician has requested that you have a carotid duplex. This test is an ultrasound of the carotid arteries in your neck. It looks at blood flow through these arteries that supply the brain with blood. Allow one hour for this exam. There are no restrictions or special instructions. [place]   Follow-Up: At [facility], you and your health needs are our priority.  As part of our continuing mission to provide you with exceptional heart care, our providers are all part of one team.  This team includes your primary Cardiologist (physician) and Advanced Practice Providers or  APPs (Physician Assistants and Nurse Practitioners) who all work together to provide you with the care you need, when you need it.  Your next appointment:   6 month(s)  Provider:   Nanetta Batty, MD     We recommend signing up for the patient portal called "MyChart".  Sign up information is provided on this After Visit Summary.  MyChart is used to connect with patients for Virtual Visits (Telemedicine).  Patients are able to view lab/test results, encounter notes, upcoming appointments, etc.  Non-urgent messages can be sent to your provider as well.   To learn more about what you can do with MyChart, go to [email or web address].         1st Floor: - Lobby - Registration  - Pharmacy  - Lab - Cafe  2nd Floor: - PV Lab - Diagnostic Testing (echo, CT, nuclear med)  3rd Floor: - Vacant  4th Floor: - TCTS (cardiothoracic surgery) - AFib Clinic - Structural Heart Clinic - Vascular Surgery  - Vascular Ultrasound  5th Floor: - HeartCare Cardiology (general and EP) - Clinical Pharmacy for coumadin, hypertension, lipid, weight-loss medications, and med management appointments    Valet parking services will be available as well.

## 2023-09-21 ENCOUNTER — Encounter (HOSPITAL_COMMUNITY)

## 2023-09-21 ENCOUNTER — Ambulatory Visit (HOSPITAL_COMMUNITY)
Admission: RE | Admit: 2023-09-21 | Discharge: 2023-09-21 | Disposition: A | Discharge status: Home / Self Care | Source: Ambulatory Visit | Attending: Cardiovascular Disease | Admitting: Cardiovascular Disease

## 2023-09-21 ENCOUNTER — Ambulatory Visit (HOSPITAL_BASED_OUTPATIENT_CLINIC_OR_DEPARTMENT_OTHER)
Admission: RE | Admit: 2023-09-21 | Discharge: 2023-09-21 | Disposition: A | Discharge status: Home / Self Care | Source: Ambulatory Visit | Attending: Cardiovascular Disease | Admitting: Cardiovascular Disease

## 2023-09-21 DIAGNOSIS — R0989 Other specified symptoms and signs involving the circulatory and respiratory systems: Secondary | ICD-10-CM | POA: Diagnosis not present

## 2023-09-21 DIAGNOSIS — I739 Peripheral vascular disease, unspecified: Secondary | ICD-10-CM | POA: Diagnosis not present

## 2023-09-22 LAB — VAS US ABI WITH/WO TBI
Left ABI: 0.56
Right ABI: 0.96

## 2023-09-23 ENCOUNTER — Ambulatory Visit (HOSPITAL_COMMUNITY): Admission: RE | Admit: 2023-09-23 | Source: Ambulatory Visit

## 2023-09-23 ENCOUNTER — Ambulatory Visit (HOSPITAL_BASED_OUTPATIENT_CLINIC_OR_DEPARTMENT_OTHER)
Admission: RE | Admit: 2023-09-23 | Discharge: 2023-09-23 | Disposition: A | Discharge status: Home / Self Care | Source: Ambulatory Visit | Attending: Cardiovascular Disease | Admitting: Cardiovascular Disease

## 2023-09-23 ENCOUNTER — Ambulatory Visit (HOSPITAL_COMMUNITY)
Admission: RE | Admit: 2023-09-23 | Discharge: 2023-09-23 | Disposition: A | Discharge status: Home / Self Care | Source: Ambulatory Visit | Attending: Cardiovascular Disease | Admitting: Cardiovascular Disease

## 2023-09-23 DIAGNOSIS — I739 Peripheral vascular disease, unspecified: Secondary | ICD-10-CM | POA: Insufficient documentation

## 2023-09-23 DIAGNOSIS — Z95820 Peripheral vascular angioplasty status with implants and grafts: Secondary | ICD-10-CM | POA: Insufficient documentation

## 2023-10-04 ENCOUNTER — Ambulatory Visit: Attending: Cardiovascular Disease | Admitting: Cardiovascular Disease

## 2023-10-04 ENCOUNTER — Encounter: Payer: Self-pay | Admitting: Cardiovascular Disease

## 2023-10-04 VITALS — BP 100/66 | HR 88 | Ht 75.0 in | Wt 220.0 lb

## 2023-10-04 DIAGNOSIS — I739 Peripheral vascular disease, unspecified: Secondary | ICD-10-CM

## 2023-10-04 MED ORDER — ATORVASTATIN CALCIUM 40 MG PO TABS: 40.0000 mg | ORAL_TABLET | Freq: Every day | ORAL | 3 refills | Status: AC

## 2023-10-04 NOTE — H&P (View-Only) (Signed)
 Adam Meyer returns today for follow-up of his Doppler studies.  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  Symptoms have somewhat improved since I saw him 2 weeks ago although he still complaining of lifestyle-limiting claudication.  I am going to get a abdominal pelvic CTA to further delineate his anatomy and plan on doing peripheral angiography after that.  He may ultimately need aortobifemoral bypass grafting.  Avanell Leigh, M.D., FACP, [facility], [facility] [address] [place], [place]  [address]  [phone] 10/04/2023 3:05 PM

## 2023-10-04 NOTE — Patient Instructions (Addendum)
 Medication Instructions:  Your physician has recommended you make the following change in your medication:   -Restart atorvastatin  (lipitor) 40mg  once daily.  *If you need a refill on your cardiac medications before your next appointment, please call your pharmacy*  Lab Work: Your physician recommends that you have labs drawn today: BMET & CBC  If you have labs (blood work) drawn today and your tests are completely normal, you will receive your results only by: MyChart Message (if you have MyChart) OR A paper copy in the mail If you have any lab test that is abnormal or we need to change your treatment, we will call you to review the results.  Testing/Procedures: Non-Cardiac CT Angiography (CTA) abdomen/pelvis, is a special type of CT scan that uses a computer to produce multi-dimensional views of major blood vessels throughout the body. In CT angiography, a contrast material is injected through an IV to help visualize the blood vessels **Needs to be done prior to 5/14**     Your physician has requested that you have an Aorta/Iliac Duplex. This will take place at [address] [facility]. 4th Floor  **To do 1-2 weeks after your procedure (5/14).  No food after 11PM the night before.  Water is OK. (Don't drink liquids if you have been instructed not to for ANOTHER test) Avoid foods that produce bowel gas, for 24 hours prior to exam (see below). No breakfast, no chewing gum, no smoking or carbonated beverages. Patient may take morning medications with water. Come in for test at least 15 minutes early to register.  Your physician has requested that you have a lower extremity arterial duplex. During this test, ultrasound is used to evaluate arterial blood flow in the legs. Allow one hour for this exam. There are no restrictions or special instructions.  This will take place at [address] [facility]. 4th Floor  **To do 1-2 weeks after your procedure (5/14).  Please note: We ask at that you not bring  children with you during ultrasound (echo/ vascular) testing. Due to room size and safety concerns, children are not allowed in the ultrasound rooms during exams. Our front office staff cannot provide observation of children in our lobby area while testing is being conducted. An adult accompanying a patient to their appointment will only be allowed in the ultrasound room at the discretion of the ultrasound technician under special circumstances. We apologize for any inconvenience.  Your physician has requested that you have an ankle brachial index (ABI). During this test an ultrasound and blood pressure cuff are used to evaluate the arteries that supply the arms and legs with blood. Allow thirty minutes for this exam. There are no restrictions or special instructions.  This will take place at [address] [facility]. 4th Floor  **To do 1-2 weeks after your procedure (5/14).  Please note: We ask at that you not bring children with you during ultrasound (echo/ vascular) testing. Due to room size and safety concerns, children are not allowed in the ultrasound rooms during exams. Our front office staff cannot provide observation of children in our lobby area while testing is being conducted. An adult accompanying a patient to their appointment will only be allowed in the ultrasound room at the discretion of the ultrasound technician under special circumstances. We apologize for any inconvenience.   Please note: We ask at that you not bring children with you during ultrasound (echo/ vascular) testing. Due to room size and safety concerns, children are not allowed in the ultrasound rooms  during exams. Our front office staff cannot provide observation of children in our lobby area while testing is being conducted. An adult accompanying a patient to their appointment will only be allowed in the ultrasound room at the discretion of the ultrasound technician under special circumstances. We apologize for any inconvenience.    Follow-Up: At [facility], you and your health needs are our priority.  As part of our continuing mission to provide you with exceptional heart care, our providers are all part of one team.  This team includes your primary Cardiologist (physician) and Advanced Practice Providers or APPs (Physician Assistants and Nurse Practitioners) who all work together to provide you with the care you need, when you need it.  Your next appointment:   2-3 week(s) after your procedure (5/14)  Provider:   Lauro Portal, MD     We recommend signing up for the patient portal called "MyChart".  Sign up information is provided on this After Visit Summary.  MyChart is used to connect with patients for Virtual Visits (Telemedicine).  Patients are able to view lab/test results, encounter notes, upcoming appointments, etc.  Non-urgent messages can be sent to your provider as well.   To learn more about what you can do with MyChart, go to [email or web address].   Other Instructions       Cardiac/Peripheral Catheterization   You are scheduled for a Peripheral Angiogram on Wednesday, May 14 with Dr. Antionette Kirks.  1. Please arrive at the [facility] (Main Entrance A) at [facility]: [address] [place], [place] [address] at 6:30 AM (This time is 2 hour(s) before your procedure to ensure your preparation).   Free valet parking service is available. You will check in at ADMITTING. The support person will be asked to wait in the waiting room.  It is OK to have someone drop you off and come back when you are ready to be discharged.        Special note: Every effort is made to have your procedure done on time. Please understand that emergencies sometimes delay scheduled procedures.  2. Diet: Do not eat solid foods after midnight.  You may have clear liquids until 5 AM the day of the procedure.  3. Labs: You will need to have blood drawn   4. Medication instructions in preparation for your  procedure:    On the morning of your procedure, take Aspirin  81 mg and any morning medicines NOT listed above.  You may use sips of water.  5. Plan to go home the same day, you will only stay overnight if medically necessary. 6. You MUST have a responsible adult to drive you home. 7. An adult MUST be with you the first 24 hours after you arrive home. 8. Bring a current list of your medications, and the last time and date medication taken. 9. Bring ID and current insurance cards. 10.Please wear clothes that are easy to get on and off and wear slip-on shoes.  Thank you for allowing us  to care for you!   -- [facility]       1st Floor: - Lobby - Registration  - Pharmacy  - Lab - Cafe  2nd Floor: - PV Lab - Diagnostic Testing (echo, CT, nuclear med)  3rd Floor: - Vacant  4th Floor: - TCTS (cardiothoracic surgery) - AFib Clinic - Structural Heart Clinic - Vascular Surgery  - Vascular Ultrasound  5th Floor: - [facility] (general and EP) - Clinical  Pharmacy for coumadin, hypertension, lipid, weight-loss medications, and med management appointments    Valet parking services will be available as well.

## 2023-10-04 NOTE — Progress Notes (Signed)
 Mr. Ashraf returns today for follow-up of his Doppler studies.  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  Symptoms have somewhat improved since I saw him 2 weeks ago although he still complaining of lifestyle-limiting claudication.  I am going to get a abdominal pelvic CTA to further delineate his anatomy and plan on doing peripheral angiography after that.  He may ultimately need aortobifemoral bypass grafting.  Avanell Leigh, M.D., FACP, [facility], [facility] [address]. [address] [place], [place]  [address]  [phone] 10/04/2023 3:05 PM

## 2023-10-05 LAB — CBC WITH DIFFERENTIAL/PLATELET
Basophils Absolute: 0 10*3/uL (ref 0.0–0.2)
Basos: 0 %
EOS (ABSOLUTE): 0.2 10*3/uL (ref 0.0–0.4)
Eos: 3 %
Hematocrit: 42.3 % (ref 37.5–51.0)
Hemoglobin: 14.3 g/dL (ref 13.0–17.7)
Immature Grans (Abs): 0 10*3/uL (ref 0.0–0.1)
Immature Granulocytes: 0 %
Lymphocytes Absolute: 3.8 10*3/uL — ABNORMAL HIGH (ref 0.7–3.1)
Lymphs: 55 %
MCH: 30.5 pg (ref 26.6–33.0)
MCHC: 33.8 g/dL (ref 31.5–35.7)
MCV: 90 fL (ref 79–97)
Monocytes Absolute: 0.5 10*3/uL (ref 0.1–0.9)
Monocytes: 7 %
Neutrophils Absolute: 2.4 10*3/uL (ref 1.4–7.0)
Neutrophils: 35 %
Platelets: 195 10*3/uL (ref 150–450)
RBC: 4.69 x10E6/uL (ref 4.14–5.80)
RDW: 13.6 % (ref 11.6–15.4)
WBC: 6.9 10*3/uL (ref 3.4–10.8)

## 2023-10-05 LAB — BASIC METABOLIC PANEL WITH GFR
BUN/Creatinine Ratio: 18 (ref 9–20)
BUN: 15 mg/dL (ref 6–24)
CO2: 26 mmol/L (ref 20–29)
Calcium: 9.6 mg/dL (ref 8.7–10.2)
Chloride: 104 mmol/L (ref 96–106)
Creatinine, Ser: 0.83 mg/dL (ref 0.76–1.27)
Glucose: 83 mg/dL (ref 70–99)
Potassium: 4.6 mmol/L (ref 3.5–5.2)
Sodium: 142 mmol/L (ref 134–144)
eGFR: 105 mL/min/{1.73_m2} (ref >59)

## 2023-10-14 ENCOUNTER — Encounter (HOSPITAL_COMMUNITY)

## 2023-10-14 ENCOUNTER — Other Ambulatory Visit: Payer: Self-pay | Admitting: Cardiovascular Disease

## 2023-10-14 ENCOUNTER — Ambulatory Visit (HOSPITAL_BASED_OUTPATIENT_CLINIC_OR_DEPARTMENT_OTHER)
Admission: RE | Admit: 2023-10-14 | Discharge: 2023-10-14 | Disposition: A | Discharge status: Home / Self Care | Source: Ambulatory Visit | Attending: Cardiovascular Disease | Admitting: Cardiovascular Disease

## 2023-10-14 ENCOUNTER — Encounter (HOSPITAL_BASED_OUTPATIENT_CLINIC_OR_DEPARTMENT_OTHER): Payer: Self-pay

## 2023-10-14 DIAGNOSIS — I739 Peripheral vascular disease, unspecified: Secondary | ICD-10-CM

## 2023-10-14 MED ORDER — IOHEXOL 350 MG/ML SOLN
100.0000 mL | Freq: Once | INTRAVENOUS | Status: AC | PRN
  Administered 2023-10-14: 100 mL via INTRAVENOUS

## 2023-10-17 ENCOUNTER — Encounter (HOSPITAL_COMMUNITY)

## 2023-10-25 ENCOUNTER — Telehealth: Payer: Self-pay | Admitting: *Deleted

## 2023-10-25 NOTE — Telephone Encounter (Addendum)
 LEA scheduled at [facility] for: Wednesday Oct 26, 2023 8:30 AM Arrival time [facility] Main Entrance A at: 6:30 AM  Nothing to eat after midnight prior to procedure, clear liquids until 5 AM day of procedure.  Medication instructions: -Usual morning medications can be taken with sips of water including aspirin  81 mg.  Plan to go home the same day, you will only stay overnight if medically necessary.  You must have responsible adult to drive you home.  Someone must be with you the first 24 hours after you arrive home.  Reviewed procedure instructions with patient.

## 2023-10-26 ENCOUNTER — Encounter (HOSPITAL_COMMUNITY)
Admission: RE | Disposition: A | Discharge status: Home / Self Care | Source: Home / Self Care | Attending: Cardiovascular Disease

## 2023-10-26 ENCOUNTER — Encounter (HOSPITAL_COMMUNITY): Payer: Self-pay | Admitting: Cardiovascular Disease

## 2023-10-26 ENCOUNTER — Other Ambulatory Visit: Payer: Self-pay

## 2023-10-26 ENCOUNTER — Ambulatory Visit (HOSPITAL_COMMUNITY)
Admission: RE | Admit: 2023-10-26 | Discharge: 2023-10-26 | Disposition: A | Discharge status: Home / Self Care | Attending: Cardiovascular Disease | Admitting: Cardiovascular Disease

## 2023-10-26 DIAGNOSIS — I739 Peripheral vascular disease, unspecified: Secondary | ICD-10-CM

## 2023-10-26 DIAGNOSIS — Z95820 Peripheral vascular angioplasty status with implants and grafts: Secondary | ICD-10-CM | POA: Insufficient documentation

## 2023-10-26 DIAGNOSIS — I70212 Atherosclerosis of native arteries of extremities with intermittent claudication, left leg: Secondary | ICD-10-CM | POA: Diagnosis present

## 2023-10-26 HISTORY — PX: LOWER EXTREMITY ANGIOGRAPHY: CATH118251

## 2023-10-26 HISTORY — PX: ABDOMINAL AORTOGRAM: CATH118222

## 2023-10-26 MED ORDER — FENTANYL CITRATE (PF) 100 MCG/2ML IJ SOLN
INTRAMUSCULAR | Status: AC
  Filled 2023-10-26: qty 2

## 2023-10-26 MED ORDER — ACETAMINOPHEN 325 MG PO TABS: 650.0000 mg | ORAL_TABLET | ORAL | Status: DC | PRN

## 2023-10-26 MED ORDER — SODIUM CHLORIDE 0.9 % IV SOLN: 250.0000 mL | INTRAVENOUS | Status: DC | PRN

## 2023-10-26 MED ORDER — LIDOCAINE HCL (PF) 1 % IJ SOLN
INTRAMUSCULAR | Status: DC | PRN
Start: 2023-10-26 — End: 2023-10-26
  Administered 2023-10-26: 15 mL

## 2023-10-26 MED ORDER — ONDANSETRON HCL 4 MG/2ML IJ SOLN: 4.0000 mg | Freq: Four times a day (QID) | INTRAMUSCULAR | Status: DC | PRN

## 2023-10-26 MED ORDER — LABETALOL HCL 5 MG/ML IV SOLN: 10.0000 mg | INTRAVENOUS | Status: DC | PRN

## 2023-10-26 MED ORDER — IODIXANOL 320 MG/ML IV SOLN
INTRAVENOUS | Status: DC | PRN
  Administered 2023-10-26: 90 mL

## 2023-10-26 MED ORDER — SODIUM CHLORIDE 0.9% FLUSH: 3.0000 mL | Freq: Two times a day (BID) | INTRAVENOUS | Status: DC

## 2023-10-26 MED ORDER — SODIUM CHLORIDE 0.9% FLUSH: 3.0000 mL | INTRAVENOUS | Status: DC | PRN

## 2023-10-26 MED ORDER — SODIUM CHLORIDE 0.9 % WEIGHT BASED INFUSION: 1.0000 mL/kg/h | INTRAVENOUS | Status: DC

## 2023-10-26 MED ORDER — SODIUM CHLORIDE 0.9 % WEIGHT BASED INFUSION: 3.0000 mL/kg/h | INTRAVENOUS | Status: AC

## 2023-10-26 MED ORDER — HEPARIN (PORCINE) IN NACL 2000-0.9 UNIT/L-% IV SOLN
INTRAVENOUS | Status: DC | PRN
  Administered 2023-10-26: 1000 mL

## 2023-10-26 MED ORDER — FENTANYL CITRATE (PF) 100 MCG/2ML IJ SOLN
INTRAMUSCULAR | Status: DC | PRN
  Administered 2023-10-26: 50 ug via INTRAVENOUS

## 2023-10-26 MED ORDER — MIDAZOLAM HCL 2 MG/2ML IJ SOLN
INTRAMUSCULAR | Status: DC | PRN
  Administered 2023-10-26: 1 mg via INTRAVENOUS

## 2023-10-26 MED ORDER — LIDOCAINE HCL (PF) 1 % IJ SOLN
INTRAMUSCULAR | Status: AC
  Filled 2023-10-26: qty 30

## 2023-10-26 MED ORDER — MIDAZOLAM HCL 2 MG/2ML IJ SOLN
INTRAMUSCULAR | Status: AC
  Filled 2023-10-26: qty 2

## 2023-10-26 MED ORDER — SODIUM CHLORIDE 0.9 % IV SOLN: INTRAVENOUS | Status: AC

## 2023-10-26 NOTE — Interval H&P Note (Signed)
 History and Physical Interval Note:  10/26/2023 8:51 AM  Adam Meyer  has presented today for surgery, with the diagnosis of pad.  The various methods of treatment have been discussed with the patient and family. After consideration of risks, benefits and other options for treatment, the patient has consented to  Procedure(s): Lower Extremity Angiography (N/A) as a surgical intervention.  The patient's history has been reviewed, patient examined, no change in status, stable for surgery.  I have reviewed the patient's chart and labs.  Questions were answered to the patient's satisfaction.     Kijuana Ruppel

## 2023-10-26 NOTE — Progress Notes (Signed)
 Up and walked and tolerated well; right groin site stable, no bleeding or hematoma

## 2023-11-03 ENCOUNTER — Ambulatory Visit (HOSPITAL_COMMUNITY)
Admission: RE | Admit: 2023-11-03 | Discharge: 2023-11-03 | Disposition: A | Discharge status: Home / Self Care | Source: Ambulatory Visit | Attending: Cardiovascular Disease | Admitting: Cardiovascular Disease

## 2023-11-03 ENCOUNTER — Ambulatory Visit (HOSPITAL_COMMUNITY)

## 2023-11-03 DIAGNOSIS — I739 Peripheral vascular disease, unspecified: Secondary | ICD-10-CM | POA: Diagnosis present

## 2023-11-03 LAB — VAS US ABI WITH/WO TBI
Left ABI: 0.59
Right ABI: 1.15

## 2023-11-04 ENCOUNTER — Ambulatory Visit: Payer: Self-pay | Admitting: Cardiovascular Disease

## 2023-11-23 ENCOUNTER — Ambulatory Visit: Attending: Cardiovascular Disease | Admitting: Cardiovascular Disease
